# Patient Record
Sex: Female | Born: 1975 | Race: Black or African American | Hispanic: No | Marital: Single | Smoking: Never smoker
Health system: Southern US, Community
[De-identification: ages and names within clinical notes are randomized; demographics above are authoritative.]

## PROBLEM LIST (undated history)

## (undated) DIAGNOSIS — N946 Dysmenorrhea, unspecified: Secondary | ICD-10-CM

## (undated) DIAGNOSIS — N809 Endometriosis, unspecified: Secondary | ICD-10-CM

## (undated) DIAGNOSIS — E7800 Pure hypercholesterolemia, unspecified: Secondary | ICD-10-CM

## (undated) DIAGNOSIS — IMO0002 Reserved for concepts with insufficient information to code with codable children: Secondary | ICD-10-CM

## (undated) DIAGNOSIS — Z973 Presence of spectacles and contact lenses: Secondary | ICD-10-CM

## (undated) DIAGNOSIS — N939 Abnormal uterine and vaginal bleeding, unspecified: Secondary | ICD-10-CM

## (undated) DIAGNOSIS — E559 Vitamin D deficiency, unspecified: Secondary | ICD-10-CM

## (undated) HISTORY — PX: OTHER SURGICAL HISTORY: SHX169

## (undated) HISTORY — DX: Dysmenorrhea, unspecified: N94.6

## (undated) HISTORY — DX: Reserved for concepts with insufficient information to code with codable children: IMO0002

## (undated) HISTORY — DX: Vitamin D deficiency, unspecified: E55.9

## (undated) HISTORY — DX: Pure hypercholesterolemia, unspecified: E78.00

---

## 2002-12-10 DIAGNOSIS — IMO0002 Reserved for concepts with insufficient information to code with codable children: Secondary | ICD-10-CM

## 2002-12-10 HISTORY — DX: Reserved for concepts with insufficient information to code with codable children: IMO0002

## 2008-12-10 HISTORY — PX: OTHER SURGICAL HISTORY: SHX169

## 2013-05-04 ENCOUNTER — Emergency Department (HOSPITAL_COMMUNITY)
Admission: EM | Admit: 2013-05-04 | Discharge: 2013-05-04 | Disposition: A | Discharge status: Home / Self Care | Payer: Medicaid Other | Attending: Emergency Medicine | Admitting: Emergency Medicine

## 2013-05-04 ENCOUNTER — Emergency Department (HOSPITAL_COMMUNITY): Payer: Medicaid Other

## 2013-05-04 DIAGNOSIS — O9989 Other specified diseases and conditions complicating pregnancy, childbirth and the puerperium: Secondary | ICD-10-CM | POA: Insufficient documentation

## 2013-05-04 DIAGNOSIS — O209 Hemorrhage in early pregnancy, unspecified: Secondary | ICD-10-CM | POA: Insufficient documentation

## 2013-05-04 DIAGNOSIS — O4410 Placenta previa with hemorrhage, unspecified trimester: Secondary | ICD-10-CM | POA: Insufficient documentation

## 2013-05-04 DIAGNOSIS — O200 Threatened abortion: Secondary | ICD-10-CM | POA: Insufficient documentation

## 2013-05-04 DIAGNOSIS — Z79899 Other long term (current) drug therapy: Secondary | ICD-10-CM | POA: Insufficient documentation

## 2013-05-04 DIAGNOSIS — R10819 Abdominal tenderness, unspecified site: Secondary | ICD-10-CM | POA: Insufficient documentation

## 2013-05-04 DIAGNOSIS — O4401 Placenta previa specified as without hemorrhage, first trimester: Secondary | ICD-10-CM

## 2013-05-04 DIAGNOSIS — O4400 Placenta previa specified as without hemorrhage, unspecified trimester: Secondary | ICD-10-CM | POA: Insufficient documentation

## 2013-05-04 LAB — URINALYSIS, ROUTINE W REFLEX MICROSCOPIC
Glucose, UA: NEGATIVE mg/dL
Ketones, ur: NEGATIVE mg/dL
Nitrite: NEGATIVE
Protein, ur: NEGATIVE mg/dL

## 2013-05-04 LAB — CBC
HCT: 35.8 % — ABNORMAL LOW (ref 36.0–46.0)
MCV: 77 fL — ABNORMAL LOW (ref 78.0–100.0)
Platelets: 184 10*3/uL (ref 150–400)
RBC: 4.65 MIL/uL (ref 3.87–5.11)
RDW: 13.6 % (ref 11.5–15.5)
WBC: 10 10*3/uL (ref 4.0–10.5)

## 2013-05-04 LAB — ABO/RH

## 2013-05-04 LAB — URINE MICROSCOPIC-ADD ON

## 2013-05-04 LAB — HCG, QUANTITATIVE, PREGNANCY: hCG, Beta Chain, Quant, S: 139388 m[IU]/mL — ABNORMAL HIGH (ref <5)

## 2013-05-04 LAB — POCT PREGNANCY, URINE: Preg Test, Ur: POSITIVE — AB

## 2013-05-04 LAB — WET PREP, GENITAL: Trich, Wet Prep: NONE SEEN

## 2013-05-04 NOTE — ED Provider Notes (Signed)
History     CSN: [id number]  Arrival date & time 05/04/13  0126   First MD Initiated Contact with Patient 05/04/13 [phone]      Chief Complaint  Patient presents with  . Vaginal Bleeding   HPI  History provided by the patient. Patient is a 37 year old female [id number], who believes she is currently [redacted] weeks pregnant who presents with symptoms of vaginal bleeding. Patient awoke suddenly with reports of a small quarter-sized amount of bleeding and blood clots she passed in the toilet when getting into the bathroom. She denied having any associated abdominal or pelvic pain and cramps. She was very concerned of the bleeding because she has history of miscarriage in the past and came straight to the emergency room. She denies feeling lightheaded or near syncope. She denies any nausea or vomiting. She has felt well recently with no urinary symptoms. Her last menstrual cycle was 2 months ago. Last month she took a pregnancy test and was found to be pregnant. She believes she may be [redacted] weeks pregnant. Denies any other aggravating or alleviating factors. No other associated symptoms.    No past medical history on file.  No past surgical history on file.  No family history on file.  History  Substance Use Topics  . Smoking status: Not on file  . Smokeless tobacco: Not on file  . Alcohol Use: Not on file    OB History   No data available      Review of Systems  Constitutional: Negative for fever, chills and diaphoresis.  Gastrointestinal: Negative for nausea, vomiting and abdominal pain.  Genitourinary: Positive for vaginal bleeding. Negative for dysuria, frequency, hematuria, flank pain, vaginal discharge, vaginal pain and pelvic pain.  All other systems reviewed and are negative.    Allergies  Review of patient's allergies indicates no known allergies.  Home Medications   Current Outpatient Rx  Name  Route  Sig  Dispense  Refill  . Ascorbic Acid (VITAMIN C PO)   Oral   Take 1  tablet by mouth daily.         Marland Kitchen ibuprofen (ADVIL,MOTRIN) 200 MG tablet   Oral   Take 400 mg by mouth every 6 (six) hours as needed for pain.           BP 121/94  Pulse 101  Temp(Src) 99 F (37.2 C) (Oral)  Resp 14  SpO2 99%  Physical Exam  Nursing note and vitals reviewed. Constitutional: She is oriented to person, place, and time. She appears well-developed and well-nourished. No distress.  HENT:  Head: Normocephalic.  Cardiovascular: Normal rate and regular rhythm.   Pulmonary/Chest: Effort normal and breath sounds normal. No respiratory distress. She has no wheezes. She has no rales.  Abdominal: Soft. There is tenderness in the right lower quadrant and suprapubic area. There is no rebound, no guarding and no tenderness at McBurney's point.  Genitourinary:  Chaperone was present. Cervix is closed. There is a small amount of blood from the cervix and within the vaginal vault. No tissue or products of conception  Neurological: She is alert and oriented to person, place, and time.  Skin: Skin is warm and dry. No rash noted.  Psychiatric: She has a normal mood and affect. Her behavior is normal.    ED Course  Procedures   Results for orders placed during the hospital encounter of 05/04/13  WET PREP, GENITAL      Result Value Range   Yeast Wet Prep HPF POC NONE  SEEN  NONE SEEN   Trich, Wet Prep NONE SEEN  NONE SEEN   Clue Cells Wet Prep HPF POC MODERATE (*) NONE SEEN   WBC, Wet Prep HPF POC MODERATE (*) NONE SEEN  HCG, QUANTITATIVE, PREGNANCY      Result Value Range   hCG, Beta Francene Finders [id number] (*) <5 mIU/mL  CBC      Result Value Range   WBC 10.0  4.0 - 10.5 K/uL   RBC 4.65  3.87 - 5.11 MIL/uL   Hemoglobin 12.2  12.0 - 15.0 g/dL   HCT 95.6 (*) 21.3 - 08.6 %   MCV 77.0 (*) 78.0 - 100.0 fL   MCH 26.2  26.0 - 34.0 pg   MCHC 34.1  30.0 - 36.0 g/dL   RDW 57.8  46.9 - 62.9 %   Platelets 184  150 - 400 K/uL  URINALYSIS, ROUTINE W REFLEX MICROSCOPIC      Result  Value Range   Color, Urine YELLOW  YELLOW   APPearance TURBID (*) CLEAR   Specific Gravity, Urine 1.019  1.005 - 1.030   pH 6.0  5.0 - 8.0   Glucose, UA NEGATIVE  NEGATIVE mg/dL   Hgb urine dipstick LARGE (*) NEGATIVE   Bilirubin Urine NEGATIVE  NEGATIVE   Ketones, ur NEGATIVE  NEGATIVE mg/dL   Protein, ur NEGATIVE  NEGATIVE mg/dL   Urobilinogen, UA 0.2  0.0 - 1.0 mg/dL   Nitrite NEGATIVE  NEGATIVE   Leukocytes, UA SMALL (*) NEGATIVE  URINE MICROSCOPIC-ADD ON      Result Value Range   Squamous Epithelial / LPF MANY (*) RARE   WBC, UA 3-6  <3 WBC/hpf   RBC / HPF 0-2  <3 RBC/hpf   Bacteria, UA MANY (*) RARE   Urine-Other MUCOUS PRESENT    POCT PREGNANCY, URINE      Result Value Range   Preg Test, Ur POSITIVE (*) NEGATIVE  ABO/RH      Result Value Range   ABO/RH(D) O POS     No rh immune globuloin NOT A RH IMMUNE GLOBULIN CANDIDATE, PT RH POSITIVE        US Ob Limited  05/04/2013   *RADIOLOGY REPORT*  Clinical Data: Vaginal bleeding.  LIMITED OBSTETRIC ULTRASOUND AND TRANSVAGINAL OB US  Number of Fetuses: 1 Heart Rate: 147 bpm Movement: Yes Presentation: Variable Placental Location: Posterior Previa: Near-complete; fluid noted extending along the edge of the cervical os. Amniotic Fluid (Subjective): Normal Amniotic Fluid (Objective):  Vertical Pocket 3.1 cm                  AFI 9.82 cm  BPD: 2.45 cm   14 weeks 1 day                     EDC: 11/01/2013  MATERNAL FINDINGS: Cervix: Closed.  There appears to be a calcified 2.4 x 1.5 x 2.2 cm fibroid along the right lateral aspect of the uterus.  The right ovary is visualized, measuring 5.0 x 3.3 x 3.4 cm, and is grossly unremarkable in appearance.  The left ovary is not imaged on this study.  No free fluid is seen in the pelvic cul-de-sac.  IMPRESSION: There appears to be near-complete placenta previa at this time; the placenta extends to the cervical os, with fluid seen tracking along the edge of the cervical os.  The placenta appears to  involve only one side of the cervical os; the cervix remains closed.  Given  the gestational age of [redacted] weeks 1 day, the placenta will likely migrate superiorly with time, and placenta previa will typically resolve spontaneously.  The gestational age does not match the LMP, reflecting a new estimated date of delivery of November 01, 2013.  These results were called by telephone on 05/04/2013 at 06:01 a.m. to [facility] PA, who verbally acknowledged these results.   Original Report Authenticated By: Tonia Ghent, M.D.   US Ob Transvaginal  05/04/2013   *RADIOLOGY REPORT*  Clinical Data: Vaginal bleeding.  LIMITED OBSTETRIC ULTRASOUND AND TRANSVAGINAL OB US  Number of Fetuses: 1 Heart Rate: 147 bpm Movement: Yes Presentation: Variable Placental Location: Posterior Previa: Near-complete; fluid noted extending along the edge of the cervical os. Amniotic Fluid (Subjective): Normal Amniotic Fluid (Objective):  Vertical Pocket 3.1 cm                  AFI 9.82 cm  BPD: 2.45 cm   14 weeks 1 day                     EDC: 11/01/2013  MATERNAL FINDINGS: Cervix: Closed.  There appears to be a calcified 2.4 x 1.5 x 2.2 cm fibroid along the right lateral aspect of the uterus.  The right ovary is visualized, measuring 5.0 x 3.3 x 3.4 cm, and is grossly unremarkable in appearance.  The left ovary is not imaged on this study.  No free fluid is seen in the pelvic cul-de-sac.  IMPRESSION: There appears to be near-complete placenta previa at this time; the placenta extends to the cervical os, with fluid seen tracking along the edge of the cervical os.  The placenta appears to involve only one side of the cervical os; the cervix remains closed.  Given the gestational age of [redacted] weeks 1 day, the placenta will likely migrate superiorly with time, and placenta previa will typically resolve spontaneously.  The gestational age does not match the LMP, reflecting a new estimated date of delivery of November 01, 2013.  These results were  called by telephone on 05/04/2013 at 06:01 a.m. to [facility] PA, who verbally acknowledged these results.   Original Report Authenticated By: Tonia Ghent, M.D.     1. Placenta previa, first trimester   2. Vaginal bleeding before [redacted] weeks gestation   3. Threatened abortion       MDM  4:20 a.m. patient seen and evaluated. Patient resting comfortably appears well in no acute distress. Denies any abdominal pains.   Spoke with the radiologist. Ultrasound concerning for placenta previa. Patient also discussed with attending physician. Will consult OB/GYN.  I spoke with Dr. Janee Morn with OB/GYN. She has taken patient information and office we'll plan to call later today if it's open or first thing tomorrow at the latest. Advised that patient should abstain from any intercourse and have complete pelvic rest until seen in the office.     Angus Seller, PA-C 05/04/13 2124

## 2013-05-04 NOTE — ED Notes (Signed)
Pt states she is approximately [redacted] weeks pregnant and today she had quarter sized blood clots while using the bathroom

## 2013-05-04 NOTE — Discharge Instructions (Signed)
You were seen and evaluated for your concerns of bleeding in early pregnancy. Her ultrasound today showed a normal developing pregnancy within your uterus. It did however show concerns for a condition called placenta previa. Please followup closely with an OB/GYN specialist. You should abstain from intercourse and have complete pelvic rest until you followup with an OB/GYN specialist.     Placenta Previa Placenta previa is a condition in which the placenta has grown low in the womb (uterus). This is a condition in which the organ which connects the fetus to the mother's uterus (placenta) is low in the opening in the uterus (cervix). It can partially or completely cover the cervix. The cause of this is unknown. It is more common with multiple births or twins. SYMPTOMS  The main symptom or sign of placenta previa is vaginal bleeding. The bleeding can be mild to very heavy. This condition can be very serious for the mother and baby. Often there are no symptoms with placenta previa. Sometimes if the location of the placenta is very low it will become partially detached and cause bleeding. This may be simply a marginal sinus separation of the placenta. This is a separation of the vessels from the wall of the uterus. This may cause no further problems other than mild anxiety. There is an increase risk of intrauterine growth restriction (IUGR) with placenta previa because of the abnormal placement of the placenta. DIAGNOSIS  The diagnosis is usually made by ultrasound exam of the uterus. There may be a careful vaginal exam to see the cervix. The patient will be prepared for a Cesarean section immediately if necessary. TREATMENT  Treatment for placenta previa is usually bed rest in the hospital or at home. You may be given medication to stop contractions. Contractions can increase bleeding. Your doctor may take fluid from the baby's sac (amniocentesis) to see if the baby's lungs are mature enough for a  C-section. A blood transfusion may be necessary if you have a low blood count. No further treatment may be needed when placenta previa is present in small degrees. Early placenta previa may resolve on it's own. The placenta moves higher in the birth canal as pregnancy progresses. In this case the placenta no longer is an obstruction to birth. The position of the placenta may need to be reconfirmed during pregnancy. This can be done with an ultrasound exam of the belly(abdomen). Call your caregiver immediately if blood loss is severe. Immediate fluid or blood replacement may be necessary. With complete placenta previa, the only way to safely deliver the baby is by Cesarean section. HOME CARE INSTRUCTIONS   Follow your caregiver's advice about bed rest.  Take any iron pills or other medications your doctor gives to you.  No bending or lifting.  Do not have sexual intercourse.  Do not put anything in your vagina (tampons or vaginal creams). If you are bleeding, use sanitary pads.  Keep your doctors appointments as scheduled. Not keeping the appointment could result in a chronic or permanent injury, pain, disability and injury or death to you or your unborn baby. If there is any problem keeping the appointment, you must call back to this facility for assistance. SEEK IMMEDIATE MEDICAL CARE IF:   You have increased bleeding.  You have fainting episodes or feel lightheaded.  You develop abdominal pain.  You can no longer feel normal fetal or baby movements.  You develop uterine contractions. Document Released: 11/26/2005 Document Revised: 02/18/2012 Document Reviewed: 07/09/2008 [employer] Patient Information 2014  [employer] ° °

## 2013-05-04 NOTE — ED Notes (Signed)
NURSE FIRST ROUNDS ; NURSE EXPLAINED DELAY , WAIT TIME AND PROCESS TO PT. AND FAMILY,  DENIES PAIN , SLIGHT TIGHTNESS AT RIGHT LOWER ABDOMEN . RESPIRATIONS UNLABORED , AMBULATORY , VITAL SIGNS RECHECKED.

## 2013-05-04 NOTE — ED Notes (Signed)
Patient states she is having lower abdominal pain starting today and passing small amount of blood, patient states positive pregnancy test x approx 1 month ago,

## 2013-05-04 NOTE — ED Notes (Addendum)
Pelvic exam performed by Ivonne Andrew PA with this RN present. Pt tolerated well.

## 2013-05-04 NOTE — ED Notes (Signed)
PT comfortable with d/c and f/u instructions. No prescriptions. Pt ambulatory.

## 2013-05-05 LAB — GC/CHLAMYDIA PROBE AMP: CT Probe RNA: NEGATIVE

## 2013-05-05 LAB — URINE CULTURE

## 2013-05-07 NOTE — ED Notes (Signed)
Post ED Visit - Positive Culture Follow-up  Culture report reviewed by antimicrobial stewardship pharmacist: []  Wes Dulaney, Pharm.D., BCPS []  Celedonio Miyamoto, [address].D., BCPS []  Georgina Pillion, Pharm.D., BCPS [x]  [place], [place].D., BCPS, AAHIVP []  Estella Husk, Pharm.D., BCPS, AAHIV  Positive urine culture   no further patient follow-up is required at this time.  Jamie [name] 05/07/2013, 2:12 PM

## 2013-05-09 NOTE — ED Provider Notes (Signed)
Medical screening examination/treatment/procedure(s) were performed by non-physician practitioner and as supervising physician I was immediately available for consultation/collaboration. Devoria Albe, MD, [facility]ando Gang   Ward Givens, MD 05/09/13 [phone]

## 2013-06-02 ENCOUNTER — Other Ambulatory Visit (HOSPITAL_COMMUNITY)
Admission: RE | Admit: 2013-06-02 | Discharge: 2013-06-02 | Disposition: A | Discharge status: Home / Self Care | Payer: Medicaid Other | Source: Ambulatory Visit | Attending: Obstetrics & Gynecology | Admitting: Obstetrics & Gynecology

## 2013-06-02 ENCOUNTER — Encounter: Payer: Self-pay | Admitting: Obstetrics & Gynecology

## 2013-06-02 ENCOUNTER — Ambulatory Visit (INDEPENDENT_AMBULATORY_CARE_PROVIDER_SITE_OTHER): Payer: Medicaid Other | Admitting: Obstetrics & Gynecology

## 2013-06-02 VITALS — BP 109/76 | Temp 97.3°F | Ht 62.0 in | Wt 134.0 lb

## 2013-06-02 DIAGNOSIS — O09529 Supervision of elderly multigravida, unspecified trimester: Secondary | ICD-10-CM

## 2013-06-02 DIAGNOSIS — IMO0002 Reserved for concepts with insufficient information to code with codable children: Secondary | ICD-10-CM | POA: Insufficient documentation

## 2013-06-02 DIAGNOSIS — Z1151 Encounter for screening for human papillomavirus (HPV): Secondary | ICD-10-CM | POA: Insufficient documentation

## 2013-06-02 DIAGNOSIS — Z01419 Encounter for gynecological examination (general) (routine) without abnormal findings: Secondary | ICD-10-CM | POA: Insufficient documentation

## 2013-06-02 DIAGNOSIS — Z3492 Encounter for supervision of normal pregnancy, unspecified, second trimester: Secondary | ICD-10-CM

## 2013-06-02 DIAGNOSIS — B373 Candidiasis of vulva and vagina: Secondary | ICD-10-CM

## 2013-06-02 DIAGNOSIS — O09522 Supervision of elderly multigravida, second trimester: Secondary | ICD-10-CM

## 2013-06-02 DIAGNOSIS — B3731 Acute candidiasis of vulva and vagina: Secondary | ICD-10-CM

## 2013-06-02 DIAGNOSIS — Z113 Encounter for screening for infections with a predominantly sexual mode of transmission: Secondary | ICD-10-CM | POA: Insufficient documentation

## 2013-06-02 MED ORDER — FLUCONAZOLE 150 MG PO TABS: 150.0000 mg | ORAL_TABLET | Freq: Once | ORAL | Status: DC

## 2013-06-02 MED ORDER — PRENATAL VITAMINS 0.8 MG PO TABS: 1.0000 | ORAL_TABLET | Freq: Every day | ORAL | Status: DC

## 2013-06-02 NOTE — Progress Notes (Signed)
Pulse 90 Edema trace in feet.

## 2013-06-02 NOTE — Patient Instructions (Signed)
Pregnancy - Second Trimester The second trimester of pregnancy (3 to 6 months) is a period of rapid growth for you and your baby. At the end of the sixth month, your baby is about 9 inches long and weighs 1 1/2 pounds. You will begin to feel the baby move between 18 and 20 weeks of the pregnancy. This is called quickening. Weight gain is faster. A clear fluid (colostrum) may leak out of your breasts. You may feel small contractions of the womb (uterus). This is known as false labor or Braxton-Hicks contractions. This is like a practice for labor when the baby is ready to be born. Usually, the problems with morning sickness have usually passed by the end of your first trimester. Some women develop small dark blotches (called cholasma, mask of pregnancy) on their face that usually goes away after the baby is born. Exposure to the sun makes the blotches worse. Acne may also develop in some pregnant women and pregnant women who have acne, may find that it goes away. PRENATAL EXAMS  Blood work may continue to be done during prenatal exams. These tests are done to check on your health and the probable health of your baby. Blood work is used to follow your blood levels (hemoglobin). Anemia (low hemoglobin) is common during pregnancy. Iron and vitamins are given to help prevent this. You will also be checked for diabetes between 24 and 28 weeks of the pregnancy. Some of the previous blood tests may be repeated.  The size of the uterus is measured during each visit. This is to make sure that the baby is continuing to grow properly according to the dates of the pregnancy.  Your blood pressure is checked every prenatal visit. This is to make sure you are not getting toxemia.  Your urine is checked to make sure you do not have an infection, diabetes or protein in the urine.  Your weight is checked often to make sure gains are happening at the suggested rate. This is to ensure that both you and your baby are  growing normally.  Sometimes, an ultrasound is performed to confirm the proper growth and development of the baby. This is a test which bounces harmless sound waves off the baby so your caregiver can more accurately determine due dates. Sometimes, a test is done on the amniotic fluid surrounding the baby. This test is called an amniocentesis. The amniotic fluid is obtained by sticking a needle into the belly (abdomen). This is done to check the chromosomes in instances where there is a concern about possible genetic problems with the baby. It is also sometimes done near the end of pregnancy if an early delivery is required. In this case, it is done to help make sure the baby's lungs are mature enough for the baby to live outside of the womb. CHANGES OCCURING IN THE SECOND TRIMESTER OF PREGNANCY Your body goes through many changes during pregnancy. They vary from person to person. Talk to your caregiver about changes you notice that you are concerned about.  During the second trimester, you will likely have an increase in your appetite. It is normal to have cravings for certain foods. This varies from person to person and pregnancy to pregnancy.  Your lower abdomen will begin to bulge.  You may have to urinate more often because the uterus and baby are pressing on your bladder. It is also common to get more bladder infections during pregnancy. You can help this by drinking lots of fluids   and emptying your bladder before and after intercourse.  You may begin to get stretch marks on your hips, abdomen, and breasts. These are normal changes in the body during pregnancy. There are no exercises or medicines to take that prevent this change.  You may begin to develop swollen and bulging veins (varicose veins) in your legs. Wearing support hose, elevating your feet for 15 minutes, 3 to 4 times a day and limiting salt in your diet helps lessen the problem.  Heartburn may develop as the uterus grows and  pushes up against the stomach. Antacids recommended by your caregiver helps with this problem. Also, eating smaller meals 4 to 5 times a day helps.  Constipation can be treated with a stool softener or adding bulk to your diet. Drinking lots of fluids, and eating vegetables, fruits, and whole grains are helpful.  Exercising is also helpful. If you have been very active up until your pregnancy, most of these activities can be continued during your pregnancy. If you have been less active, it is helpful to start an exercise program such as walking.  Hemorrhoids may develop at the end of the second trimester. Warm sitz baths and hemorrhoid cream recommended by your caregiver helps hemorrhoid problems.  Backaches may develop during this time of your pregnancy. Avoid heavy lifting, wear low heal shoes, and practice good posture to help with backache problems.  Some pregnant women develop tingling and numbness of their hand and fingers because of swelling and tightening of ligaments in the wrist (carpel tunnel syndrome). This goes away after the baby is born.  As your breasts enlarge, you may have to get a bigger bra. Get a comfortable, cotton, support bra. Do not get a nursing bra until the last month of the pregnancy if you will be nursing the baby.  You may get a dark line from your belly button to the pubic area called the linea nigra.  You may develop rosy cheeks because of increase blood flow to the face.  You may develop spider looking lines of the face, neck, arms, and chest. These go away after the baby is born. HOME CARE INSTRUCTIONS   It is extremely important to avoid all smoking, herbs, alcohol, and unprescribed drugs during your pregnancy. These chemicals affect the formation and growth of the baby. Avoid these chemicals throughout the pregnancy to ensure the delivery of a healthy infant.  Most of your home care instructions are the same as suggested for the first trimester of your  pregnancy. Keep your caregiver's appointments. Follow your caregiver's instructions regarding medicine use, exercise, and diet.  During pregnancy, you are providing food for you and your baby. Continue to eat regular, well-balanced meals. Choose foods such as meat, fish, milk and other low fat dairy products, vegetables, fruits, and whole-grain breads and cereals. Your caregiver will tell you of the ideal weight gain.  A physical sexual relationship may be continued up until near the end of pregnancy if there are no other problems. Problems could include early (premature) leaking of amniotic fluid from the membranes, vaginal bleeding, abdominal pain, or other medical or pregnancy problems.  Exercise regularly if there are no restrictions. Check with your caregiver if you are unsure of the safety of some of your exercises. The greatest weight gain will occur in the last 2 trimesters of pregnancy. Exercise will help you:  Control your weight.  Get you in shape for labor and delivery.  Lose weight after you have the baby.  Wear   a good support or jogging bra for breast tenderness during pregnancy. This may help if worn during sleep. Pads or tissues may be used in the bra if you are leaking colostrum.  Do not use hot tubs, steam rooms or saunas throughout the pregnancy.  Wear your seat belt at all times when driving. This protects you and your baby if you are in an accident.  Avoid raw meat, uncooked cheese, cat litter boxes, and soil used by cats. These carry germs that can cause birth defects in the baby.  The second trimester is also a good time to visit your dentist for your dental health if this has not been done yet. Getting your teeth cleaned is okay. Use a soft toothbrush. Brush gently during pregnancy.  It is easier to leak urine during pregnancy. Tightening up and strengthening the pelvic muscles will help with this problem. Practice stopping your urination while you are going to the  bathroom. These are the same muscles you need to strengthen. It is also the muscles you would use as if you were trying to stop from passing gas. You can practice tightening these muscles up 10 times a set and repeating this about 3 times per day. Once you know what muscles to tighten up, do not perform these exercises during urination. It is more likely to contribute to an infection by backing up the urine.  Ask for help if you have financial, counseling, or nutritional needs during pregnancy. Your caregiver will be able to offer counseling for these needs as well as refer you for other special needs.  Your skin may become oily. If so, wash your face with mild soap, use non-greasy moisturizer and oil or cream based makeup. MEDICINES AND DRUG USE IN PREGNANCY  Take prenatal vitamins as directed. The vitamin should contain 1 milligram of folic acid. Keep all vitamins out of reach of children. Only a couple vitamins or tablets containing iron may be fatal to a baby or young child when ingested.  Avoid use of all medicines, including herbs, over-the-counter medicines, not prescribed or suggested by your caregiver. Only take over-the-counter or prescription medicines for pain, discomfort, or fever as directed by your caregiver. Do not use aspirin.  Let your caregiver also know about herbs you may be using.  Alcohol is related to a number of birth defects. This includes fetal alcohol syndrome. All alcohol, in any form, should be avoided completely. Smoking will cause low birth rate and premature babies.  Street or illegal drugs are very harmful to the baby. They are absolutely forbidden. A baby born to an addicted mother will be addicted at birth. The baby will go through the same withdrawal an adult does. SEEK MEDICAL CARE IF:  You have any concerns or worries during your pregnancy. It is better to call with your questions if you feel they cannot wait, rather than worry about them. SEEK IMMEDIATE  MEDICAL CARE IF:   An unexplained oral temperature above 102 F (38.9 C) develops, or as your caregiver suggests.  You have leaking of fluid from the vagina (birth canal). If leaking membranes are suspected, take your temperature and tell your caregiver of this when you call.  There is vaginal spotting, bleeding, or passing clots. Tell your caregiver of the amount and how many pads are used. Light spotting in pregnancy is common, especially following intercourse.  You develop a bad smelling vaginal discharge with a change in the color from clear to white.  You continue to feel   sick to your stomach (nauseated) and have no relief from remedies suggested. You vomit blood or coffee ground-like materials.  You lose more than 2 pounds of weight or gain more than 2 pounds of weight over 1 week, or as suggested by your caregiver.  You notice swelling of your face, hands, feet, or legs.  You get exposed to German measles and have never had them.  You are exposed to fifth disease or chickenpox.  You develop belly (abdominal) pain. Round ligament discomfort is a common non-cancerous (benign) cause of abdominal pain in pregnancy. Your caregiver still must evaluate you.  You develop a bad headache that does not go away.  You develop fever, diarrhea, pain with urination, or shortness of breath.  You develop visual problems, blurry, or double vision.  You fall or are in a car accident or any kind of trauma.  There is mental or physical violence at home. Document Released: 11/20/2001 Document Revised: 08/20/2012 Document Reviewed: 05/25/2009 [employer] Patient Information 2014 [employer].  

## 2013-06-02 NOTE — Progress Notes (Signed)
   Subjective:    Jamie [name] is a [id number] [redacted]w[redacted]d being seen today for her first obstetrical visit.  Her obstetrical history is significant for two term SVDs and current advanced maternal age.  Patient was seen in [facility]U for bleeding around 14 weeks, no bleeding since then. Normal placenta on ultrasound. Patient reports that she has a yeast infection; wants treatment for this.  Also wants prenatal vitamins.   Filed Vitals:   06/02/13 1350 06/02/13 1354  BP: 109/76   Temp: 97.3 F (36.3 C)   Height:  5\' 2"  (1.575 m)  Weight: 134 lb (60.782 kg)   FHR 145, FH 18  HISTORY: OB History   Grav Para Term Preterm Abortions TAB SAB Ect Mult Living   4 2 2  0 1 0 1   2     # Outc Date GA Lbr Len/2nd Wgt Sex Del Anes PTL Lv   1 TRM 7/01 [redacted]w[redacted]d    SVD      2 TRM 12/12 [redacted]w[redacted]d   M SVD EPI  Yes   3 SAB            4 CUR              Past Medical History  Diagnosis Date  . Abnormal Pap smear 2004   History reviewed. No pertinent past surgical history. Family History  Problem Relation Age of Onset  . Hyperlipidemia Mother   . Hyperlipidemia Maternal Grandmother     Exam    Uterus:  Fundal Height: 18 cm  Pelvic Exam:    Perineum: No Hemorrhoids, Normal Perineum   Vulva: Erythema around introitus   Vagina:  normal mucosa, thick cottage-cheese like white discharge noted.    Cervix: multiparous appearance and mild bleeding following pap   Adnexa: normal adnexa and no mass, fullness, tenderness   Bony Pelvis: gynecoid  System: Breast:  normal appearance, no masses or tenderness   Skin: normal coloration and turgor, no rashes   Neurologic: normal   Extremities: normal strength, tone, and muscle mass   HEENT PERRLA   Mouth/Teeth mucous membranes moist, pharynx normal without lesions and dental hygiene good   Neck supple and no masses   Cardiovascular: regular rate and rhythm   Respiratory:  appears well, vitals normal, no respiratory distress, acyanotic, normal RR   Abdomen: soft,  non-tender; bowel sounds normal; no masses,  no organomegaly   Urinary: urethral meatus normal     Assessment:    Pregnancy: [id number] Patient Active Problem List   Diagnosis Date Noted  . Advanced maternal age, antepartum 06/02/2013     Plan:   Initial labs drawn. Prenatal vitamins prescribed. Diflucan ordered for presumptive yeast infection; patient to call if symptoms worsen Problem list reviewed and updated. Genetic Screening discussed Quad Screen: ordered. Ultrasound discussed; fetal survey: ordered. Follow up as scheduled  Tereso Newcomer, M.D. 06/02/2013

## 2013-06-03 LAB — OBSTETRIC PANEL
Eosinophils Absolute: 0.1 10*3/uL (ref 0.0–0.7)
Hemoglobin: 11.7 g/dL — ABNORMAL LOW (ref 12.0–15.0)
Hepatitis B Surface Ag: NEGATIVE
Lymphocytes Relative: 20 % (ref 12–46)
Lymphs Abs: 1.9 10*3/uL (ref 0.7–4.0)
MCH: 25.7 pg — ABNORMAL LOW (ref 26.0–34.0)
Monocytes Relative: 6 % (ref 3–12)
Neutro Abs: 7 10*3/uL (ref 1.7–7.7)
Neutrophils Relative %: 73 % (ref 43–77)
Platelets: 188 10*3/uL (ref 150–400)
RBC: 4.55 MIL/uL (ref 3.87–5.11)
Rh Type: POSITIVE
WBC: 9.4 10*3/uL (ref 4.0–10.5)

## 2013-06-04 LAB — CULTURE, OB URINE: Colony Count: 60000

## 2013-06-04 LAB — HEMOGLOBINOPATHY EVALUATION
Hemoglobin Other: 0 %
Hgb A: 96.8 % (ref 96.8–97.8)
Hgb S Quant: 0 %

## 2013-06-05 LAB — POCT URINALYSIS DIP (DEVICE)
Glucose, UA: NEGATIVE mg/dL
Protein, ur: NEGATIVE mg/dL
Urobilinogen, UA: 1 mg/dL (ref 0.0–1.0)

## 2013-06-08 ENCOUNTER — Encounter: Payer: Self-pay | Admitting: Advanced Practice Midwife

## 2013-06-09 ENCOUNTER — Encounter: Payer: Self-pay | Admitting: *Deleted

## 2013-06-09 ENCOUNTER — Ambulatory Visit (HOSPITAL_COMMUNITY)
Admission: RE | Admit: 2013-06-09 | Discharge: 2013-06-09 | Disposition: A | Discharge status: Home / Self Care | Payer: Medicaid Other | Source: Ambulatory Visit | Attending: Obstetrics & Gynecology | Admitting: Obstetrics & Gynecology

## 2013-06-09 ENCOUNTER — Encounter: Payer: Self-pay | Admitting: Obstetrics & Gynecology

## 2013-06-09 DIAGNOSIS — O4440 Low lying placenta NOS or without hemorrhage, unspecified trimester: Secondary | ICD-10-CM | POA: Insufficient documentation

## 2013-06-09 DIAGNOSIS — O09529 Supervision of elderly multigravida, unspecified trimester: Secondary | ICD-10-CM | POA: Insufficient documentation

## 2013-06-09 DIAGNOSIS — Z3480 Encounter for supervision of other normal pregnancy, unspecified trimester: Secondary | ICD-10-CM | POA: Insufficient documentation

## 2013-06-09 DIAGNOSIS — Z3492 Encounter for supervision of normal pregnancy, unspecified, second trimester: Secondary | ICD-10-CM

## 2013-06-30 ENCOUNTER — Ambulatory Visit (INDEPENDENT_AMBULATORY_CARE_PROVIDER_SITE_OTHER): Payer: Medicaid Other | Admitting: Obstetrics and Gynecology

## 2013-06-30 ENCOUNTER — Encounter: Payer: Self-pay | Admitting: Obstetrics and Gynecology

## 2013-06-30 VITALS — BP 114/72 | Temp 98.1°F | Wt 142.7 lb

## 2013-06-30 DIAGNOSIS — O4400 Placenta previa specified as without hemorrhage, unspecified trimester: Secondary | ICD-10-CM

## 2013-06-30 DIAGNOSIS — O09522 Supervision of elderly multigravida, second trimester: Secondary | ICD-10-CM

## 2013-06-30 DIAGNOSIS — O4402 Placenta previa specified as without hemorrhage, second trimester: Secondary | ICD-10-CM

## 2013-06-30 DIAGNOSIS — O09529 Supervision of elderly multigravida, unspecified trimester: Secondary | ICD-10-CM

## 2013-06-30 DIAGNOSIS — B373 Candidiasis of vulva and vagina: Secondary | ICD-10-CM

## 2013-06-30 LAB — POCT URINALYSIS DIP (DEVICE)
Bilirubin Urine: NEGATIVE
Glucose, UA: NEGATIVE mg/dL
Nitrite: NEGATIVE
Specific Gravity, Urine: 1.02 (ref 1.005–1.030)
Urobilinogen, UA: 0.2 mg/dL (ref 0.0–1.0)

## 2013-06-30 NOTE — Progress Notes (Signed)
Pulse- 96   Edema-hands/feet  Pain-"when he balls up" Pt is currently taking diflucan for yeast infection

## 2013-06-30 NOTE — Progress Notes (Signed)
Patient doing well complaining of vaginal pruritis and discharge consistent with yeast infection. Patient took diflucan last week and symptoms returned. Advised to take diflucan again and if symptoms persist will obtain wet prep to rule out BV. Low lying placenta precautions reviewed. Will repeat ultrasound at 28 weeks.

## 2013-07-28 ENCOUNTER — Ambulatory Visit (INDEPENDENT_AMBULATORY_CARE_PROVIDER_SITE_OTHER): Payer: Medicaid Other | Admitting: Obstetrics & Gynecology

## 2013-07-28 VITALS — BP 107/72 | Temp 97.0°F | Wt 150.8 lb

## 2013-07-28 DIAGNOSIS — O4402 Placenta previa specified as without hemorrhage, second trimester: Secondary | ICD-10-CM

## 2013-07-28 DIAGNOSIS — O4400 Placenta previa specified as without hemorrhage, unspecified trimester: Secondary | ICD-10-CM

## 2013-07-28 DIAGNOSIS — Z23 Encounter for immunization: Secondary | ICD-10-CM

## 2013-07-28 LAB — POCT URINALYSIS DIP (DEVICE)
Ketones, ur: NEGATIVE mg/dL
Protein, ur: NEGATIVE mg/dL
Specific Gravity, Urine: 1.015 (ref 1.005–1.030)
Urobilinogen, UA: 0.2 mg/dL (ref 0.0–1.0)
pH: 7 (ref 5.0–8.0)

## 2013-07-28 LAB — CBC
Platelets: 159 10*3/uL (ref 150–400)
RBC: 4.24 MIL/uL (ref 3.87–5.11)
RDW: 16.3 % — ABNORMAL HIGH (ref 11.5–15.5)
WBC: 11.3 10*3/uL — ABNORMAL HIGH (ref 4.0–10.5)

## 2013-07-28 MED ORDER — TETANUS-DIPHTH-ACELL PERTUSSIS 5-2.5-18.5 LF-MCG/0.5 IM SUSP
0.5000 mL | Freq: Once | INTRAMUSCULAR | Status: AC
  Administered 2013-07-28: 0.5 mL via INTRAMUSCULAR

## 2013-07-28 NOTE — Addendum Note (Signed)
Addended by: Kathee Delton on: 07/28/2013 09:42 AM   Modules accepted: Orders

## 2013-07-28 NOTE — Progress Notes (Signed)
Routine visit. Good FM. Glucola and labs, TDAP today. She wants a PPS, so she will sign MCD forms today.

## 2013-07-28 NOTE — Progress Notes (Signed)
Pulse- 102 Patient reports some discomfort with fetal movements

## 2013-07-29 LAB — RPR

## 2013-07-29 LAB — HIV ANTIBODY (ROUTINE TESTING W REFLEX): HIV: NONREACTIVE

## 2013-07-29 LAB — GLUCOSE TOLERANCE, 1 HOUR (50G) W/O FASTING: Glucose, 1 Hour GTT: 86 mg/dL (ref 70–140)

## 2013-08-07 ENCOUNTER — Encounter: Payer: Self-pay | Admitting: *Deleted

## 2013-08-11 ENCOUNTER — Ambulatory Visit (HOSPITAL_COMMUNITY)
Admission: RE | Admit: 2013-08-11 | Discharge: 2013-08-11 | Disposition: A | Discharge status: Home / Self Care | Payer: Medicaid Other | Source: Ambulatory Visit | Attending: Obstetrics & Gynecology | Admitting: Obstetrics & Gynecology

## 2013-08-11 ENCOUNTER — Other Ambulatory Visit: Payer: Self-pay | Admitting: Obstetrics & Gynecology

## 2013-08-11 DIAGNOSIS — O4400 Placenta previa specified as without hemorrhage, unspecified trimester: Secondary | ICD-10-CM | POA: Insufficient documentation

## 2013-08-11 DIAGNOSIS — O09529 Supervision of elderly multigravida, unspecified trimester: Secondary | ICD-10-CM | POA: Insufficient documentation

## 2013-08-11 DIAGNOSIS — O4402 Placenta previa specified as without hemorrhage, second trimester: Secondary | ICD-10-CM

## 2013-08-12 NOTE — Progress Notes (Addendum)
Jamie [name]  was seen today for an ultrasound appointment.  See full report in [facility].  Impression: Single IUP at 28 2/7 weeks Echogenic intracardiac focus was again noted No other markers associated with aneuploidy were appreciated Interval anatomy was otherwise within normal limits A posterior placenta was noted, well away from the cervical os Normal amniotic fluid volume  Remote read - the patient was not counseled regarding the findings of an echogenic intracardiac focus  Recommendations: Follow-up ultrasounds as clinically indicated.   Alpha Gula, MD

## 2013-08-18 ENCOUNTER — Encounter: Payer: Medicaid Other | Admitting: Family Medicine

## 2013-08-19 ENCOUNTER — Encounter: Payer: Self-pay | Admitting: Obstetrics & Gynecology

## 2013-08-26 ENCOUNTER — Ambulatory Visit (INDEPENDENT_AMBULATORY_CARE_PROVIDER_SITE_OTHER): Payer: Medicaid Other | Admitting: Family Medicine

## 2013-08-26 ENCOUNTER — Encounter: Payer: Self-pay | Admitting: Family Medicine

## 2013-08-26 ENCOUNTER — Encounter: Payer: Self-pay | Admitting: *Deleted

## 2013-08-26 VITALS — BP 116/77 | Temp 97.6°F | Wt 157.3 lb

## 2013-08-26 DIAGNOSIS — O09523 Supervision of elderly multigravida, third trimester: Secondary | ICD-10-CM

## 2013-08-26 DIAGNOSIS — O09529 Supervision of elderly multigravida, unspecified trimester: Secondary | ICD-10-CM

## 2013-08-26 LAB — POCT URINALYSIS DIP (DEVICE)
Hgb urine dipstick: NEGATIVE
Nitrite: NEGATIVE
Protein, ur: NEGATIVE mg/dL
Specific Gravity, Urine: 1.02 (ref 1.005–1.030)
Urobilinogen, UA: 0.2 mg/dL (ref 0.0–1.0)

## 2013-08-26 NOTE — Progress Notes (Signed)
S: pt is a 37 yo [id number] @ [redacted]w[redacted]d here for return OBV - doing well. No concerns -good fm - No ctx, LOF, VB  O: see flowsheet  A/p:  Doing well Preterm Labor and FM precautions discussed

## 2013-08-26 NOTE — Progress Notes (Signed)
P=92,   

## 2013-09-01 ENCOUNTER — Encounter (HOSPITAL_COMMUNITY): Payer: Self-pay | Admitting: *Deleted

## 2013-09-01 ENCOUNTER — Inpatient Hospital Stay (HOSPITAL_COMMUNITY)
Admission: AD | Admit: 2013-09-01 | Discharge: 2013-09-08 | DRG: 765 | Disposition: A | Discharge status: Home / Self Care | Payer: Medicaid Other | Source: Ambulatory Visit | Attending: Obstetrics & Gynecology | Admitting: Obstetrics & Gynecology

## 2013-09-01 DIAGNOSIS — O09529 Supervision of elderly multigravida, unspecified trimester: Secondary | ICD-10-CM | POA: Diagnosis present

## 2013-09-01 DIAGNOSIS — O411090 Infection of amniotic sac and membranes, unspecified, unspecified trimester, not applicable or unspecified: Secondary | ICD-10-CM | POA: Diagnosis present

## 2013-09-01 DIAGNOSIS — O321XX0 Maternal care for breech presentation, not applicable or unspecified: Secondary | ICD-10-CM | POA: Diagnosis present

## 2013-09-01 DIAGNOSIS — O09522 Supervision of elderly multigravida, second trimester: Secondary | ICD-10-CM

## 2013-09-01 DIAGNOSIS — O4290 Premature rupture of membranes, unspecified as to length of time between rupture and onset of labor, unspecified weeks of gestation: Principal | ICD-10-CM | POA: Diagnosis present

## 2013-09-01 DIAGNOSIS — O09523 Supervision of elderly multigravida, third trimester: Secondary | ICD-10-CM

## 2013-09-01 DIAGNOSIS — Z302 Encounter for sterilization: Secondary | ICD-10-CM

## 2013-09-01 DIAGNOSIS — IMO0002 Reserved for concepts with insufficient information to code with codable children: Secondary | ICD-10-CM

## 2013-09-01 DIAGNOSIS — O26879 Cervical shortening, unspecified trimester: Secondary | ICD-10-CM | POA: Diagnosis present

## 2013-09-01 DIAGNOSIS — O42913 Preterm premature rupture of membranes, unspecified as to length of time between rupture and onset of labor, third trimester: Secondary | ICD-10-CM | POA: Diagnosis present

## 2013-09-01 NOTE — MAU Note (Signed)
Pt G4 P2 at 31.2wks leaking clear fluid since 1500, and having abd pressure that comes and goes. Denies problems with pregnancy.

## 2013-09-02 ENCOUNTER — Encounter (HOSPITAL_COMMUNITY): Payer: Self-pay | Admitting: *Deleted

## 2013-09-02 ENCOUNTER — Inpatient Hospital Stay (HOSPITAL_COMMUNITY): Payer: Medicaid Other

## 2013-09-02 LAB — CBC
HCT: 29.9 % — ABNORMAL LOW (ref 36.0–46.0)
Hemoglobin: 10 g/dL — ABNORMAL LOW (ref 12.0–15.0)
MCHC: 33.4 g/dL (ref 30.0–36.0)
Platelets: 133 10*3/uL — ABNORMAL LOW (ref 150–400)
RBC: 3.71 MIL/uL — ABNORMAL LOW (ref 3.87–5.11)

## 2013-09-02 LAB — AMNISURE RUPTURE OF MEMBRANE (ROM) NOT AT ARMC: Amnisure ROM: POSITIVE

## 2013-09-02 LAB — FETAL FIBRONECTIN: Fetal Fibronectin: POSITIVE — AB

## 2013-09-02 MED ORDER — DOCUSATE SODIUM 100 MG PO CAPS
100.0000 mg | ORAL_CAPSULE | Freq: Every day | ORAL | Status: DC
  Administered 2013-09-02 – 2013-09-03 (×2): 100 mg via ORAL
  Filled 2013-09-02 (×2): qty 1

## 2013-09-02 MED ORDER — SODIUM CHLORIDE 0.9 % IV SOLN
2.0000 g | Freq: Four times a day (QID) | INTRAVENOUS | Status: AC
  Administered 2013-09-02 – 2013-09-03 (×8): 2 g via INTRAVENOUS
  Filled 2013-09-02 (×8): qty 2000

## 2013-09-02 MED ORDER — SODIUM CHLORIDE 0.9 % IV SOLN
250.0000 mg | Freq: Four times a day (QID) | INTRAVENOUS | Status: AC
  Administered 2013-09-02 – 2013-09-03 (×8): 250 mg via INTRAVENOUS
  Filled 2013-09-02 (×8): qty 250

## 2013-09-02 MED ORDER — MAGNESIUM SULFATE BOLUS VIA INFUSION
6.0000 g | Freq: Once | INTRAVENOUS | Status: AC
  Administered 2013-09-02: 6 g via INTRAVENOUS
  Filled 2013-09-02: qty 500

## 2013-09-02 MED ORDER — CALCIUM CARBONATE ANTACID 500 MG PO CHEW
2.0000 | CHEWABLE_TABLET | ORAL | Status: DC | PRN
  Administered 2013-09-04: 400 mg via ORAL
  Filled 2013-09-02: qty 2

## 2013-09-02 MED ORDER — LACTATED RINGERS IV SOLN: INTRAVENOUS | Status: DC

## 2013-09-02 MED ORDER — ACETAMINOPHEN 325 MG PO TABS: 650.0000 mg | ORAL_TABLET | ORAL | Status: DC | PRN

## 2013-09-02 MED ORDER — ERYTHROMYCIN BASE 250 MG PO TABS
250.0000 mg | ORAL_TABLET | Freq: Four times a day (QID) | ORAL | Status: DC
Start: 2013-09-04 — End: 2013-09-06
  Administered 2013-09-04 – 2013-09-06 (×10): 250 mg via ORAL
  Filled 2013-09-02 (×15): qty 1

## 2013-09-02 MED ORDER — BETAMETHASONE SOD PHOS & ACET 6 (3-3) MG/ML IJ SUSP
12.0000 mg | Freq: Once | INTRAMUSCULAR | Status: AC
  Administered 2013-09-02: 12 mg via INTRAMUSCULAR
  Filled 2013-09-02: qty 2

## 2013-09-02 MED ORDER — AMOXICILLIN 500 MG PO CAPS
500.0000 mg | ORAL_CAPSULE | Freq: Three times a day (TID) | ORAL | Status: DC
  Administered 2013-09-04 – 2013-09-05 (×7): 500 mg via ORAL
  Filled 2013-09-02 (×11): qty 1

## 2013-09-02 MED ORDER — BETAMETHASONE SOD PHOS & ACET 6 (3-3) MG/ML IJ SUSP
12.0000 mg | Freq: Once | INTRAMUSCULAR | Status: AC
  Administered 2013-09-03: 12 mg via INTRAMUSCULAR
  Filled 2013-09-02: qty 2

## 2013-09-02 MED ORDER — ZOLPIDEM TARTRATE 5 MG PO TABS: 5.0000 mg | ORAL_TABLET | Freq: Every evening | ORAL | Status: DC | PRN

## 2013-09-02 MED ORDER — TERBUTALINE SULFATE 1 MG/ML IJ SOLN
0.2500 mg | Freq: Once | INTRAMUSCULAR | Status: AC
Start: 2013-09-02 — End: 2013-09-02
  Administered 2013-09-02: 0.25 mg via INTRAVENOUS
  Filled 2013-09-02: qty 1

## 2013-09-02 MED ORDER — MAGNESIUM SULFATE 40 G IN LACTATED RINGERS - SIMPLE
2.0000 g/h | INTRAVENOUS | Status: DC
  Administered 2013-09-02: 2 g/h via INTRAVENOUS
  Filled 2013-09-02 (×2): qty 500

## 2013-09-02 MED ORDER — LACTATED RINGERS IV SOLN
INTRAVENOUS | Status: DC
  Administered 2013-09-02 – 2013-09-04 (×5): via INTRAVENOUS

## 2013-09-02 MED ORDER — PRENATAL MULTIVITAMIN CH
1.0000 | ORAL_TABLET | Freq: Every day | ORAL | Status: DC
  Administered 2013-09-02 – 2013-09-05 (×4): 1 via ORAL
  Filled 2013-09-02 (×4): qty 1

## 2013-09-02 NOTE — Progress Notes (Signed)
UR completed 

## 2013-09-02 NOTE — Progress Notes (Signed)
RN called second RN at bedside to adjust fetal cardio after RN at bedside for 15 minutes, fetal movement palpated, cardio tracing maternal, fetal heart tones found.

## 2013-09-02 NOTE — Progress Notes (Signed)
Pt sitting at side of bed after returning from restroom. Changing under buttocks pad and pt gown due to complaints of it being wet. FHR monitor and TOCO adjusted after pt back in bed at 0751.

## 2013-09-02 NOTE — Consult Note (Signed)
Neonatology Consult  Note:  At the request of the patients obstetrician Dr. Penne Lash I met with Jamie [name] who is at 12 3 wks currently with pregnancy complicated by  PPROM and PTL.  H/O short cervix treated with IM progesterone during her last pregnancy however has not been on progesterone this pregnancy.   She will receive her second dose of  betamethasone tonight and is on magnesium sulfate 2g/hr.  On latency antibiotics - ampicillin and erythromycin.    We reviewed initial delivery room management, including CPAP, [place], and low but certainly possible need for intubation for surfactant administration.  We discussed feeding immaturity and need for full po intake with multiple days of good weight gain and no apnea or bradycardia before discharge.  We reviewed increased risk of jaundice, infection, and temperature instability.   Discussed likely length of stay.  Thank you for allowing [place] to participate in her care.  Please call with questions.  John Giovanni, DO  Neonatologist  The total length of face-to-face or floor / unit time for this encounter was 20 minutes.  Counseling and / or coordination of care was greater than fifty percent of the time.

## 2013-09-02 NOTE — MAU Provider Note (Signed)
Jamie [name] is a 37 y.o. female [phone] with IUP at [redacted]w[redacted]d presenting for contraction and LOF. Pt states she has been having regular, every 3-5 minutes contractions, associated with no vaginal bleeding.  Membranes are ruptured, clear fluid, with active fetal movement.   [facility] 18.2 [facility]ks  H/O short cervix treated with IM progesterone during her last pregnancy. Recent US on 9/2 showed cervical length of 3.9cm. She reports no problems during this pregnancy and has not been on progesterone.   Prenatal History/Complications: none, see above  Past Medical History: Past Medical History  Diagnosis Date  . Abnormal Pap smear 2004    Past Surgical History: Past Surgical History  Procedure Laterality Date  . No past surgeries      Obstetrical History: OB History   Grav Para Term Preterm Abortions TAB SAB Ect Mult Living   4 2 2  0 1 0 1   2      Gynecological History: OB History   Grav Para Term Preterm Abortions TAB SAB Ect Mult Living   4 2 2  0 1 0 1   2      Social History: History   Social History  . Marital Status: Single    Spouse Name: N/A    Number of Children: N/A  . Years of Education: N/A   Social History Main Topics  . Smoking status: Never Smoker   . Smokeless tobacco: Never Used  . Alcohol Use: No  . Drug Use: No  . Sexual Activity: Not Currently    Birth Control/ Protection: None   Other Topics Concern  . None   Social History Narrative  . None    Family History: Family History  Problem Relation Age of Onset  . Hyperlipidemia Mother   . Hyperlipidemia Maternal Grandmother     Allergies: No Known Allergies  Prescriptions prior to admission  Medication Sig Dispense Refill  . Prenatal Multivit-Min-Fe-FA (PRENATAL VITAMINS) 0.8 MG tablet Take 1 tablet by mouth daily.  30 tablet  12     Review of Systems   Constitutional: Negative for fever, chills, weight loss, malaise/fatigue and diaphoresis.  HENT: Negative for hearing loss,  ear pain, nosebleeds, congestion, sore throat, neck pain, tinnitus and ear discharge.   Eyes: Negative for blurred vision, double vision, photophobia, pain, discharge and redness.  Respiratory: Negative for cough, hemoptysis, sputum production, shortness of breath, wheezing and stridor.   Cardiovascular: Negative for chest pain, palpitations, orthopnea,  leg swelling  Gastrointestinal: Positive for abdominal pain. Negative for heartburn, nausea, vomiting, diarrhea, constipation, blood in stool Genitourinary: Negative for dysuria, urgency, frequency, hematuria and flank pain.  Musculoskeletal: Negative for myalgias, back pain, joint pain and falls.  Skin: Negative for itching and rash.  Neurological: Negative for dizziness, tingling, tremors, sensory change, speech change, focal weakness, seizures, loss of consciousness, weakness and headaches.  Endo/Heme/Allergies: Negative for environmental allergies and polydipsia. Does not bruise/bleed easily.  Psychiatric/Behavioral: Negative for depression, suicidal ideas, hallucinations, memory loss and substance abuse. The patient is not nervous/anxious and does not have insomnia.       Blood pressure 104/73, pulse 91, temperature 98.2 F (36.8 C), resp. rate 18, height 5\' 2"  (1.575 m), weight 73.12 kg (161 lb 3.2 oz), last menstrual period 02/16/2013. General appearance: alert, cooperative, appears stated age and no distress Lungs: clear to auscultation bilaterally Heart: regular rate and rhythm Abdomen: soft, non-tender; bowel sounds normal Pelvic: + vaginal pooling Extremities: Homans sign is negative, no sign of DVT Presentation: cephalic  on US Fetal monitoringBaseline: 140 bpm +accels, - decel Uterine activityDate/time of onset: 09/02/2013 around 1500, Frequency: Every 3 minutes, Duration: 20 seconds and Intensity: moderate     Prenatal labs: ABO, Rh: O/POS/-- (06/24 1506) Antibody: NEG (06/24 1506) Rubella:   RPR: NON REAC (08/19 1040)   HBsAg: NEGATIVE (06/24 1506)  HIV: NON REACTIVE (08/19 1040)  GBS:   pending 1 hr Glucola 86 Genetic screening  AFP neg Anatomy US normal  FFN positive Amnisure positive  Assessment: Jamie [name] is a 37 y.o. [id number] with an IUP at [redacted]w[redacted]d presenting for PPROM  Plan: Admit to Antenatal for PPROM - Received 1L LR bolus and 1st dose of betamethasone in MAU - sent GBS culuture - continue NS with mag for total of 140ml/hr - Start mag with 6g bolus then 2g/hr - give 2nd dose of betamethasone in 24 hrs - continuous toco and FHT monitoring - send for limited US for cervical length - ampicillin and erythromycin for PPROM - NICU consult  Marissa Calamity, MD 09/02/2013, 12:48 AM  I have seen and examined this patient and I agree with the above. Cam Hai 7:20 AM 09/02/2013

## 2013-09-03 DIAGNOSIS — O4290 Premature rupture of membranes, unspecified as to length of time between rupture and onset of labor, unspecified weeks of gestation: Principal | ICD-10-CM

## 2013-09-03 DIAGNOSIS — O411090 Infection of amniotic sac and membranes, unspecified, unspecified trimester, not applicable or unspecified: Secondary | ICD-10-CM

## 2013-09-03 DIAGNOSIS — O42913 Preterm premature rupture of membranes, unspecified as to length of time between rupture and onset of labor, third trimester: Secondary | ICD-10-CM | POA: Diagnosis present

## 2013-09-03 DIAGNOSIS — O26879 Cervical shortening, unspecified trimester: Secondary | ICD-10-CM

## 2013-09-03 MED ORDER — DOCUSATE SODIUM 100 MG PO CAPS
100.0000 mg | ORAL_CAPSULE | Freq: Two times a day (BID) | ORAL | Status: DC
  Administered 2013-09-03 – 2013-09-05 (×5): 100 mg via ORAL
  Filled 2013-09-03 (×5): qty 1

## 2013-09-03 MED ORDER — POLYETHYLENE GLYCOL 3350 17 G PO PACK
17.0000 g | PACK | Freq: Every day | ORAL | Status: DC | PRN
  Filled 2013-09-03: qty 1

## 2013-09-03 NOTE — Progress Notes (Signed)
Pt instructed to get up to void

## 2013-09-03 NOTE — MAU Provider Note (Signed)
Attestation of Attending Supervision of Advanced Practitioner: Evaluation and management procedures were performed by the PA/NP/CNM/OB Fellow under my supervision/collaboration. Chart reviewed and agree with management and plan.  Tawna Alwin V 09/03/2013 10:20 AM

## 2013-09-03 NOTE — Progress Notes (Signed)
Patient ID: Jamie [name], female   DOB: Jun 13, 1976, 37 y.o.   MRN: [id number] FACULTY PRACTICE ANTEPARTUM(COMPREHENSIVE) NOTE  Jamie [name] is a 37 y.o. [id number] at [redacted]w[redacted]d by early ultrasound who is admitted for rupture of membranes.   Fetal presentation is cephalic. Length of Stay:  2  Days  Subjective: No complaints Patient reports the fetal movement as active. Patient reports uterine contraction  activity as none. Patient reports  vaginal bleeding as none. Patient describes fluid per vagina as Other small amount, less than at admission.  Vitals:  Blood pressure 92/52, pulse 96, temperature 98.3 F (36.8 C), temperature source Oral, resp. rate 18, height 5\' 2"  (1.575 m), weight 157 lb 12.8 oz (71.578 kg), last menstrual period 02/16/2013, SpO2 99.00%. Physical Examination:  General appearance - alert, well appearing, and in no distress Heart - normal rate and regular rhythm Abdomen - soft, nontender, nondistended Fundal Height:  size equals dates Cervical Exam: Not evaluated. and found to be hourglassing membranes by u.s.   Membranes:ruptured, by admitting provider direct visualization  Fetal Monitoring:  Baseline: 145 bpm no decels, no contraction  Labs:  No results found for this or any previous visit (from the past 24 hour(s)).  Imaging Studies:     Currently EPIC will not allow sonographic studies to automatically populate into notes.  In the meantime, copy and paste results into note or free text.  Medications:  Scheduled . ampicillin (OMNIPEN) IV  2 g Intravenous Q6H   Followed by  . [START ON 09/04/2013] amoxicillin  500 mg Oral Q8H  . docusate sodium  100 mg Oral Daily  . erythromycin  250 mg Intravenous Q6H   Followed by  . [START ON 09/04/2013] erythromycin  250 mg Oral Q6H  . prenatal multivitamin  1 tablet Oral Q1200   I have reviewed the patient's current medications.  ASSESSMENT: Patient Active Problem List   Diagnosis Date Noted  . Preterm premature  rupture of membranes in third trimester 31 wk 09/03/2013  . Posterior low lying placenta 06/09/2013  . Advanced maternal age, antepartum 06/02/2013    PLAN: INpatient care, antibiotics per protocol,  D/c mag sulfate now. Has completed BMZ x 2 Inpatient care til delivery at 34 wks or earlier prn Chorioamnionitis  Jamie [name] V 09/03/2013,10:11 AM

## 2013-09-04 ENCOUNTER — Inpatient Hospital Stay (HOSPITAL_COMMUNITY): Payer: Medicaid Other

## 2013-09-04 LAB — CULTURE, BETA STREP (GROUP B ONLY)

## 2013-09-04 MED ORDER — SODIUM CHLORIDE 0.9 % IJ SOLN
3.0000 mL | Freq: Three times a day (TID) | INTRAMUSCULAR | Status: DC
  Administered 2013-09-04 – 2013-09-05 (×4): 3 mL via INTRAVENOUS

## 2013-09-04 NOTE — Progress Notes (Signed)
MD notified of pt FHR strip.  MD reviewed and good to cont to monitor  Q4 hours

## 2013-09-04 NOTE — Progress Notes (Signed)
Pt sitting up in the bed eating breakfast  

## 2013-09-04 NOTE — Progress Notes (Signed)
Patient ID: Jamie [name], female   DOB: 1976-06-13, 37 y.o.   MRN: [id number] FACULTY PRACTICE ANTEPARTUM(COMPREHENSIVE) NOTE  Jamie [name] is a 37 y.o. [id number] at [redacted]w[redacted]d by early ultrasound who is admitted for preterm premature rupture of membranes (PPROM). Fetal presentation is cephalic. Length of Stay:  3  Days  Subjective: No complaints Patient reports the fetal movement as active. Patient reports uterine contraction  activity as none. Patient reports  vaginal bleeding as none. Patient describes fluid per vagina as very small amount, less than at admission.  Vitals:  Blood pressure 98/57, pulse 95, temperature 99 F (37.2 C), temperature source Oral, resp. rate 18, height 5\' 2"  (1.575 m), weight 157 lb 12.8 oz (71.578 kg), last menstrual period 02/16/2013, SpO2 99.00%. Physical Examination: General appearance - alert, well appearing, and in no distress Abdomen - soft, nontender, nondistended Fundal Height:  size equals dates Cervical Exam: Not evaluated. and found to be hourglassing membranes by u.s.   Membranes:ruptured, by admitting provider direct visualization  Fetal Monitoring:  Baseline: 145 bpm, +accels, no decels, no contractions  Labs:  No results found for this or any previous visit (from the past 24 hour(s)).  Imaging Studies:     Currently EPIC will not allow sonographic studies to automatically populate into notes.  In the meantime, copy and paste results into note or free text.  Medications:  Scheduled . amoxicillin  500 mg Oral Q8H  . docusate sodium  100 mg Oral BID  . erythromycin  250 mg Oral Q6H  . prenatal multivitamin  1 tablet Oral Q1200   I have reviewed the patient's current medications.  ASSESSMENT: Patient Active Problem List   Diagnosis Date Noted  . Preterm premature rupture of membranes in third trimester 31 wk 09/03/2013  . Advanced maternal age, antepartum 06/02/2013    PLAN: Continue inpatient care, latency antibiotics per  protocol,  Has completed BMZ x 2 and magnesium sulfate Formal ultrasound ordered today as per MFM recommendations Inpatient care until delivery at 34 wks or earlier prn chorioamnionitis  Tionne Dayhoff A, M.D. 09/04/2013,10:24 AM

## 2013-09-04 NOTE — Progress Notes (Signed)
Patient ID: Jamie [name], female   DOB: 05-19-1976, 37 y.o.   MRN: [id number] I received a call from MFM regarding pt.  AFI not measurable and fetus is now breech.  MFM has already counseled pt re mode and timing of delivery. I met with pt to confirm her understanding.    Pt denies contractions or abdominal pain.  She reports that she has not had leakage of fluid since last night.  She confirms that she understands that the fetus is breech.  She reports that she understands the risk of cord prolapse.  I explained to her that she should notify the nurse if she noted increased pelvic pressure, leaking or any blood per vagina.  I reviewed with her the risks of prematurity including ROP, NEC, IVH and fetal lung immaturity.    As per MFM we will do antenatal testing weekly in their unit and I will order NST with toco q 4 hours.  All questions were answered.  Of note, pt is aware that her delivery will be by c-section if she remains breech.  [name] L. Harraway-Smith, M.D., [facility]

## 2013-09-05 NOTE — Progress Notes (Signed)
Patient ID: Jamie [name], female   DOB: [birth year], 37 y.o.   MRN: [id number]  Pt had pink dinged spotting. Sterile speculum exam performed--pink tinged mucous.  No frank bleeding.  Cervix visually 1-2 cm.  No fetal parts extruding through cervix. Bedside US confirms breech. Will keep on continuous monitoring for know. No ctx currently.  FHT reassuring.   Abdomen is NT.

## 2013-09-05 NOTE — Progress Notes (Signed)
Patient ID: Jamie [name], female   DOB: Aug 25, 1976, 37 y.o.   MRN: [id number]  Maternal Diabetes: No  Genetic Screening: Normal  Maternal Ultrasounds/Referrals: Normal --LVEIF noted Fetal Ultrasounds or other Referrals: None  Maternal Substance Abuse: No  Significant Maternal Medications: None  Significant Maternal Lab Results: None  Other Comments: None

## 2013-09-05 NOTE — Progress Notes (Signed)
Patient ID: Jamie [name], female   DOB: 02-22-1976, 37 y.o.   MRN: [id number] ACULTY PRACTICE ANTEPARTUM COMPREHENSIVE PROGRESS NOTE  Jamie [name] is a 37 y.o. [id number] at [redacted]w[redacted]d  who is admitted for PROM.   Fetal presentation is breech. Length of Stay:  4  Days  Subjective: Pt reports continued leakage of clear fluid.  She denies increased pelvic pressure. Patient reports good fetal movement.  She reports no uterine contractions, no bleeding and no loss of fluid per vagina.  Vitals:  Blood pressure 90/51, pulse 83, temperature 97.9 F (36.6 C), temperature source Oral, resp. rate 14, height 5\' 2"  (1.575 m), weight 157 lb 12.8 oz (71.578 kg), last menstrual period 02/16/2013, SpO2 99.00%. Physical Examination: General appearance - alert, well appearing, and in no distress Abdomen - soft, nontender, nondistended, no masses or organomegaly gravid Extremities - peripheral pulses normal, no pedal edema, no clubbing or cyanosis Cervical Exam: Not evaluated.  Of note the fetus is breech with the feet in, but not through the cervix per MFM.  Extremities: extremities normal, atraumatic, no cyanosis or edema Membranes:intact, ruptured  Fetal Monitoring:  Baseline: 150's bpm, Variability: Good {> 6 bpm), Accelerations: Reactive and Decelerations: Variable: mild occ  Labs:  No results found for this or any previous visit (from the past 24 hour(s)).  Imaging Studies:    sono done yesterday AFI: not measurable.  BREECH (cord in upper abd well away from lower uterine segment   Medications:  Scheduled . amoxicillin  500 mg Oral Q8H  . docusate sodium  100 mg Oral BID  . erythromycin  250 mg Oral Q6H  . prenatal multivitamin  1 tablet Oral Q1200  . sodium chloride  3 mL Intravenous Q8H   I have reviewed the patient's current medications.  ASSESSMENT: Patient Active Problem List   Diagnosis Date Noted  . Preterm premature rupture of membranes in third trimester 31 wk 09/03/2013  .  Advanced maternal age, antepartum 06/02/2013    PLAN: Continue q 4 hour monitoring Deliver for s/sx of infection or other maternal or fetal condition Continue routine antenatal care. Weekly eval in MFM   HARRAWAY-SMITH, Shereese Bonnie 09/05/2013,7:51 AM

## 2013-09-06 ENCOUNTER — Encounter (HOSPITAL_COMMUNITY)
Admission: AD | Disposition: A | Discharge status: Home / Self Care | Payer: Self-pay | Source: Ambulatory Visit | Attending: Obstetrics & Gynecology

## 2013-09-06 ENCOUNTER — Encounter (HOSPITAL_COMMUNITY): Payer: Self-pay | Admitting: Anesthesiology

## 2013-09-06 ENCOUNTER — Inpatient Hospital Stay (HOSPITAL_COMMUNITY): Payer: Medicaid Other | Admitting: Anesthesiology

## 2013-09-06 DIAGNOSIS — O26879 Cervical shortening, unspecified trimester: Secondary | ICD-10-CM

## 2013-09-06 DIAGNOSIS — O4290 Premature rupture of membranes, unspecified as to length of time between rupture and onset of labor, unspecified weeks of gestation: Secondary | ICD-10-CM

## 2013-09-06 DIAGNOSIS — O411090 Infection of amniotic sac and membranes, unspecified, unspecified trimester, not applicable or unspecified: Secondary | ICD-10-CM

## 2013-09-06 HISTORY — PX: TUBAL LIGATION: SHX77

## 2013-09-06 LAB — ABO/RH: ABO/RH(D): O POS

## 2013-09-06 LAB — CBC
HCT: 34.2 % — ABNORMAL LOW (ref 36.0–46.0)
Hemoglobin: 11.2 g/dL — ABNORMAL LOW (ref 12.0–15.0)
MCH: 26.4 pg (ref 26.0–34.0)
MCHC: 32.7 g/dL (ref 30.0–36.0)
RBC: 4.24 MIL/uL (ref 3.87–5.11)

## 2013-09-06 LAB — TYPE AND SCREEN
ABO/RH(D): O POS
Antibody Screen: NEGATIVE

## 2013-09-06 SURGERY — Surgical Case
Anesthesia: Spinal | Site: Abdomen | Wound class: Clean Contaminated

## 2013-09-06 MED ORDER — LANOLIN HYDROUS EX OINT: 1.0000 "application " | TOPICAL_OINTMENT | CUTANEOUS | Status: DC | PRN

## 2013-09-06 MED ORDER — PRENATAL MULTIVITAMIN CH
1.0000 | ORAL_TABLET | Freq: Every day | ORAL | Status: DC
  Administered 2013-09-07 – 2013-09-08 (×2): 1 via ORAL
  Filled 2013-09-06 (×2): qty 1

## 2013-09-06 MED ORDER — SCOPOLAMINE 1 MG/3DAYS TD PT72
1.0000 | MEDICATED_PATCH | Freq: Once | TRANSDERMAL | Status: DC
  Administered 2013-09-06: 1.5 mg via TRANSDERMAL

## 2013-09-06 MED ORDER — ONDANSETRON HCL 4 MG/2ML IJ SOLN: 4.0000 mg | Freq: Three times a day (TID) | INTRAMUSCULAR | Status: DC | PRN

## 2013-09-06 MED ORDER — NALOXONE HCL 0.4 MG/ML IJ SOLN: 0.4000 mg | INTRAMUSCULAR | Status: DC | PRN

## 2013-09-06 MED ORDER — SCOPOLAMINE 1 MG/3DAYS TD PT72
MEDICATED_PATCH | TRANSDERMAL | Status: AC
  Filled 2013-09-06: qty 1

## 2013-09-06 MED ORDER — FENTANYL CITRATE 0.05 MG/ML IJ SOLN
INTRAMUSCULAR | Status: DC | PRN
  Administered 2013-09-06: 15 ug via INTRATHECAL

## 2013-09-06 MED ORDER — BUPIVACAINE IN DEXTROSE 0.75-8.25 % IT SOLN
INTRATHECAL | Status: DC | PRN
  Administered 2013-09-06: 1.5 mL via INTRATHECAL

## 2013-09-06 MED ORDER — ZOLPIDEM TARTRATE 5 MG PO TABS: 5.0000 mg | ORAL_TABLET | Freq: Every evening | ORAL | Status: DC | PRN

## 2013-09-06 MED ORDER — KETOROLAC TROMETHAMINE 30 MG/ML IJ SOLN: 30.0000 mg | Freq: Four times a day (QID) | INTRAMUSCULAR | Status: AC | PRN

## 2013-09-06 MED ORDER — DIPHENHYDRAMINE HCL 25 MG PO CAPS: 25.0000 mg | ORAL_CAPSULE | ORAL | Status: DC | PRN

## 2013-09-06 MED ORDER — NALBUPHINE HCL 10 MG/ML IJ SOLN
5.0000 mg | INTRAMUSCULAR | Status: DC | PRN
  Filled 2013-09-06: qty 1

## 2013-09-06 MED ORDER — SIMETHICONE 80 MG PO CHEW: 80.0000 mg | CHEWABLE_TABLET | ORAL | Status: DC | PRN

## 2013-09-06 MED ORDER — OXYTOCIN 40 UNITS IN LACTATED RINGERS INFUSION - SIMPLE MED: 62.5000 mL/h | INTRAVENOUS | Status: DC

## 2013-09-06 MED ORDER — HYDROMORPHONE HCL PF 1 MG/ML IJ SOLN: 0.2500 mg | INTRAMUSCULAR | Status: DC | PRN

## 2013-09-06 MED ORDER — LACTATED RINGERS IV SOLN
INTRAVENOUS | Status: DC | PRN
  Administered 2013-09-06 (×3): via INTRAVENOUS

## 2013-09-06 MED ORDER — MEPERIDINE HCL 25 MG/ML IJ SOLN
6.2500 mg | INTRAMUSCULAR | Status: DC | PRN
  Administered 2013-09-06: 6.25 mg via INTRAVENOUS

## 2013-09-06 MED ORDER — MORPHINE SULFATE 0.5 MG/ML IJ SOLN
INTRAMUSCULAR | Status: AC
  Filled 2013-09-06: qty 10

## 2013-09-06 MED ORDER — MEPERIDINE HCL 25 MG/ML IJ SOLN: 6.2500 mg | INTRAMUSCULAR | Status: DC | PRN

## 2013-09-06 MED ORDER — KETOROLAC TROMETHAMINE 60 MG/2ML IM SOLN
60.0000 mg | Freq: Once | INTRAMUSCULAR | Status: AC | PRN
  Administered 2013-09-06: 60 mg via INTRAMUSCULAR

## 2013-09-06 MED ORDER — OXYCODONE-ACETAMINOPHEN 5-325 MG PO TABS
1.0000 | ORAL_TABLET | ORAL | Status: DC | PRN
  Administered 2013-09-06 – 2013-09-07 (×2): 1 via ORAL
  Administered 2013-09-07: 2 via ORAL
  Filled 2013-09-06: qty 1
  Filled 2013-09-06 (×2): qty 2
  Filled 2013-09-06: qty 1

## 2013-09-06 MED ORDER — WITCH HAZEL-GLYCERIN EX PADS: 1.0000 "application " | MEDICATED_PAD | CUTANEOUS | Status: DC | PRN

## 2013-09-06 MED ORDER — MORPHINE SULFATE (PF) 0.5 MG/ML IJ SOLN
INTRAMUSCULAR | Status: DC | PRN
  Administered 2013-09-06: 2 mg via INTRAVENOUS
  Administered 2013-09-06: .9 mg via INTRAVENOUS
  Administered 2013-09-06: .1 mg via EPIDURAL
  Administered 2013-09-06: 2 mg via INTRAVENOUS

## 2013-09-06 MED ORDER — KETOROLAC TROMETHAMINE 60 MG/2ML IM SOLN
INTRAMUSCULAR | Status: AC
  Filled 2013-09-06: qty 2

## 2013-09-06 MED ORDER — MEPERIDINE HCL 25 MG/ML IJ SOLN
INTRAMUSCULAR | Status: AC
  Filled 2013-09-06: qty 1

## 2013-09-06 MED ORDER — IBUPROFEN 600 MG PO TABS
600.0000 mg | ORAL_TABLET | Freq: Four times a day (QID) | ORAL | Status: DC
  Administered 2013-09-06 – 2013-09-08 (×7): 600 mg via ORAL
  Filled 2013-09-06 (×7): qty 1

## 2013-09-06 MED ORDER — INFLUENZA VAC SPLIT QUAD 0.5 ML IM SUSP
0.5000 mL | INTRAMUSCULAR | Status: AC
  Administered 2013-09-07: 0.5 mL via INTRAMUSCULAR
  Filled 2013-09-06: qty 0.5

## 2013-09-06 MED ORDER — SENNOSIDES-DOCUSATE SODIUM 8.6-50 MG PO TABS
2.0000 | ORAL_TABLET | Freq: Every day | ORAL | Status: DC
  Administered 2013-09-06 – 2013-09-07 (×2): 2 via ORAL

## 2013-09-06 MED ORDER — 0.9 % SODIUM CHLORIDE (POUR BTL) OPTIME
TOPICAL | Status: DC | PRN
  Administered 2013-09-06: 1000 mL

## 2013-09-06 MED ORDER — LACTATED RINGERS IV SOLN: INTRAVENOUS | Status: DC

## 2013-09-06 MED ORDER — MAGNESIUM SULFATE 40 G IN LACTATED RINGERS - SIMPLE
2.0000 g/h | INTRAVENOUS | Status: DC
  Filled 2013-09-06: qty 500

## 2013-09-06 MED ORDER — PROMETHAZINE HCL 25 MG/ML IJ SOLN: 6.2500 mg | INTRAMUSCULAR | Status: DC | PRN

## 2013-09-06 MED ORDER — ONDANSETRON HCL 4 MG/2ML IJ SOLN: 4.0000 mg | INTRAMUSCULAR | Status: DC | PRN

## 2013-09-06 MED ORDER — MAGNESIUM SULFATE BOLUS VIA INFUSION
4.0000 g | Freq: Once | INTRAVENOUS | Status: AC
  Administered 2013-09-06: 4 g via INTRAVENOUS
  Filled 2013-09-06: qty 500

## 2013-09-06 MED ORDER — SODIUM CHLORIDE 0.9 % IV SOLN
INTRAVENOUS | Status: DC
  Administered 2013-09-06: 10:00:00 via INTRAVENOUS

## 2013-09-06 MED ORDER — OXYTOCIN 10 UNIT/ML IJ SOLN
INTRAMUSCULAR | Status: AC
  Filled 2013-09-06: qty 4

## 2013-09-06 MED ORDER — DIPHENHYDRAMINE HCL 50 MG/ML IJ SOLN: 12.5000 mg | INTRAMUSCULAR | Status: DC | PRN

## 2013-09-06 MED ORDER — MEASLES, MUMPS & RUBELLA VAC ~~LOC~~ INJ: 0.5000 mL | INJECTION | Freq: Once | SUBCUTANEOUS | Status: DC

## 2013-09-06 MED ORDER — DIPHENHYDRAMINE HCL 25 MG PO CAPS: 25.0000 mg | ORAL_CAPSULE | Freq: Four times a day (QID) | ORAL | Status: DC | PRN

## 2013-09-06 MED ORDER — SIMETHICONE 80 MG PO CHEW: 80.0000 mg | CHEWABLE_TABLET | ORAL | Status: DC

## 2013-09-06 MED ORDER — CITRIC ACID-SODIUM CITRATE 334-500 MG/5ML PO SOLN
ORAL | Status: AC
  Administered 2013-09-06: 30 mL
  Filled 2013-09-06: qty 15

## 2013-09-06 MED ORDER — PHENYLEPHRINE HCL 10 MG/ML IJ SOLN
INTRAMUSCULAR | Status: DC | PRN
  Administered 2013-09-06 (×2): 80 ug via INTRAVENOUS
  Administered 2013-09-06 (×2): 40 ug via INTRAVENOUS
  Administered 2013-09-06 (×2): 80 ug via INTRAVENOUS

## 2013-09-06 MED ORDER — SIMETHICONE 80 MG PO CHEW
80.0000 mg | CHEWABLE_TABLET | Freq: Three times a day (TID) | ORAL | Status: DC
  Administered 2013-09-06 – 2013-09-08 (×7): 80 mg via ORAL

## 2013-09-06 MED ORDER — NALOXONE HCL 1 MG/ML IJ SOLN
1.0000 ug/kg/h | INTRAVENOUS | Status: DC | PRN
  Filled 2013-09-06: qty 2

## 2013-09-06 MED ORDER — ONDANSETRON HCL 4 MG/2ML IJ SOLN
INTRAMUSCULAR | Status: AC
  Filled 2013-09-06: qty 2

## 2013-09-06 MED ORDER — OXYTOCIN 10 UNIT/ML IJ SOLN
40.0000 [IU] | INTRAMUSCULAR | Status: DC | PRN
  Administered 2013-09-06: 40 [IU] via INTRAVENOUS

## 2013-09-06 MED ORDER — MENTHOL 3 MG MT LOZG: 1.0000 | LOZENGE | OROMUCOSAL | Status: DC | PRN

## 2013-09-06 MED ORDER — FENTANYL CITRATE 0.05 MG/ML IJ SOLN
INTRAMUSCULAR | Status: AC
  Filled 2013-09-06: qty 2

## 2013-09-06 MED ORDER — ONDANSETRON HCL 4 MG/2ML IJ SOLN
INTRAMUSCULAR | Status: DC | PRN
  Administered 2013-09-06: 4 mg via INTRAVENOUS

## 2013-09-06 MED ORDER — DIBUCAINE 1 % RE OINT: 1.0000 "application " | TOPICAL_OINTMENT | RECTAL | Status: DC | PRN

## 2013-09-06 MED ORDER — DIPHENHYDRAMINE HCL 50 MG/ML IJ SOLN: 25.0000 mg | INTRAMUSCULAR | Status: DC | PRN

## 2013-09-06 MED ORDER — PHENYLEPHRINE 40 MCG/ML (10ML) SYRINGE FOR IV PUSH (FOR BLOOD PRESSURE SUPPORT)
PREFILLED_SYRINGE | INTRAVENOUS | Status: AC
  Filled 2013-09-06: qty 10

## 2013-09-06 MED ORDER — METOCLOPRAMIDE HCL 5 MG/ML IJ SOLN: 10.0000 mg | Freq: Three times a day (TID) | INTRAMUSCULAR | Status: DC | PRN

## 2013-09-06 MED ORDER — ONDANSETRON HCL 4 MG PO TABS: 4.0000 mg | ORAL_TABLET | ORAL | Status: DC | PRN

## 2013-09-06 MED ORDER — MAGNESIUM HYDROXIDE 400 MG/5ML PO SUSP: 30.0000 mL | ORAL | Status: DC | PRN

## 2013-09-06 MED ORDER — CEFAZOLIN SODIUM-DEXTROSE 2-3 GM-% IV SOLR
2.0000 g | Freq: Once | INTRAVENOUS | Status: AC
  Administered 2013-09-06: 2 g via INTRAVENOUS
  Filled 2013-09-06: qty 50

## 2013-09-06 MED ORDER — FENTANYL CITRATE 0.05 MG/ML IJ SOLN
INTRAMUSCULAR | Status: DC | PRN
  Administered 2013-09-06: 50 ug via INTRAVENOUS
  Administered 2013-09-06: 10 ug via INTRAVENOUS
  Administered 2013-09-06: 25 ug via INTRAVENOUS

## 2013-09-06 MED ORDER — SODIUM CHLORIDE 0.9 % IJ SOLN: 3.0000 mL | INTRAMUSCULAR | Status: DC | PRN

## 2013-09-06 MED ORDER — TETANUS-DIPHTH-ACELL PERTUSSIS 5-2.5-18.5 LF-MCG/0.5 IM SUSP: 0.5000 mL | Freq: Once | INTRAMUSCULAR | Status: DC

## 2013-09-06 SURGICAL SUPPLY — 34 items
BENZOIN TINCTURE PRP APPL 2/3 (GAUZE/BANDAGES/DRESSINGS) ×2 IMPLANT
CLAMP CORD UMBIL (MISCELLANEOUS) IMPLANT
CLIP FILSHIE TUBAL LIGA STRL (Clip) ×4 IMPLANT
CLOTH BEACON ORANGE TIMEOUT ST (SAFETY) ×2 IMPLANT
DRAPE LG THREE QUARTER DISP (DRAPES) ×4 IMPLANT
DRSG OPSITE POSTOP 4X10 (GAUZE/BANDAGES/DRESSINGS) ×2 IMPLANT
DURAPREP 26ML APPLICATOR (WOUND CARE) ×2 IMPLANT
ELECT REM PT RETURN 9FT ADLT (ELECTROSURGICAL) ×2
ELECTRODE REM PT RTRN 9FT ADLT (ELECTROSURGICAL) ×1 IMPLANT
EXTRACTOR VACUUM KIWI (MISCELLANEOUS) IMPLANT
GLOVE BIO SURGEON ST LM GN SZ9 (GLOVE) ×2 IMPLANT
GLOVE BIOGEL PI IND STRL 9 (GLOVE) ×1 IMPLANT
GLOVE BIOGEL PI INDICATOR 9 (GLOVE) ×1
GOWN PREVENTION PLUS XLARGE (GOWN DISPOSABLE) ×2 IMPLANT
GOWN STRL REIN 3XL LVL4 (GOWN DISPOSABLE) ×2 IMPLANT
GOWN STRL REIN XL XLG (GOWN DISPOSABLE) ×2 IMPLANT
NEEDLE HYPO 25X5/8 SAFETYGLIDE (NEEDLE) ×2 IMPLANT
NS IRRIG 1000ML POUR BTL (IV SOLUTION) ×2 IMPLANT
PACK C SECTION WH (CUSTOM PROCEDURE TRAY) ×2 IMPLANT
PAD OB MATERNITY 4.3X12.25 (PERSONAL CARE ITEMS) ×2 IMPLANT
RETRACTOR WND ALEXIS 25 LRG (MISCELLANEOUS) IMPLANT
RTRCTR C-SECT PINK 25CM LRG (MISCELLANEOUS) IMPLANT
RTRCTR WOUND ALEXIS 25CM LRG (MISCELLANEOUS)
STRIP CLOSURE SKIN 1/2X4 (GAUZE/BANDAGES/DRESSINGS) ×2 IMPLANT
SUT CHROMIC 0 CTX 36 (SUTURE) ×6 IMPLANT
SUT VIC AB 0 CT1 27 (SUTURE) ×1
SUT VIC AB 0 CT1 27XBRD ANBCTR (SUTURE) ×1 IMPLANT
SUT VIC AB 2-0 CT1 27 (SUTURE) ×2
SUT VIC AB 2-0 CT1 TAPERPNT 27 (SUTURE) ×2 IMPLANT
SUT VIC AB 4-0 KS 27 (SUTURE) ×2 IMPLANT
SYR BULB IRRIGATION 50ML (SYRINGE) IMPLANT
TOWEL OR 17X24 6PK STRL BLUE (TOWEL DISPOSABLE) ×2 IMPLANT
TRAY FOLEY CATH 14FR (SET/KITS/TRAYS/PACK) ×2 IMPLANT
WATER STERILE IRR 1000ML POUR (IV SOLUTION) ×2 IMPLANT

## 2013-09-06 NOTE — OR Nursing (Signed)
Filshie clips applied to right and left fallopian tubes by Dr. Sherryl Manges on 09/06/2013. Lot number-[id number]. Expiration date-02/2016. Manufacture - [employer].

## 2013-09-06 NOTE — Consult Note (Signed)
Neonatology Note:   Attendance at C-section:    I was asked by Dr. Emelda Fear to attend this primary C/S at 32 0/[redacted] weeks GA due to breech presentation and NRFHR in the presence of PPROM and low AFI. The mother is a G4P2A1 O pos, GBS neg with SROM on 9/23. She received 2 doses of Betamethasone on 9/24-25, Magnesium sulfate for neuroprotection, and Ampicillin and Erythromycin. Fetal ultrasound had shown an echogenic intracardiac focus at 28 weeks. ROM 5 days prior to delivery, fluid clear. The mother was afebrile during labor. At delivery, infant vigorous with good spontaneous cry and tone. Needed only minimal bulb suctioning. Ap 9/9. Lungs clear to ausc in DR. A pulse oximeter was placed and he had normal O2 saturations throughout, in room air. Shown to mother briefly in OR, then transported to the NICU for further care, with his father in attendance.   Doretha Sou, MD

## 2013-09-06 NOTE — Progress Notes (Signed)
Magnesium sulfate decreased to 2gm/hr.

## 2013-09-06 NOTE — Op Note (Signed)
Attestation of Attending Supervision of Advanced Practitioner: Evaluation and management procedures were performed by the PA/NP/CNM/OB Fellow under my supervision/collaboration. Chart reviewed and agree with management and plan.  Brionne Mertz V 09/06/2013 6:45 PM

## 2013-09-06 NOTE — Anesthesia Procedure Notes (Addendum)
Spinal  Patient location during procedure: OR Start time: 09/06/2013 11:12 AM End time: 09/06/2013 11:15 AM Staffing Anesthesiologist: Lewie Loron R Performed by: anesthesiologist  Preanesthetic Checklist Completed: patient identified, surgical consent, pre-op evaluation, timeout performed, IV checked, risks and benefits discussed and monitors and equipment checked Spinal Block Patient position: sitting Prep: DuraPrep Patient monitoring: continuous pulse ox, blood pressure and heart rate Approach: midline Location: L2-3 Injection technique: single-shot Needle Needle type: Sprotte  Needle gauge: 24 G Needle length: 9 cm Assessment Sensory level: T6 Events: paresthesia Additional Notes Expiration date of kit checked and confirmed. Patient tolerated procedure well, without complications.

## 2013-09-06 NOTE — Op Note (Signed)
Jamie [name] PROCEDURE DATE: 09/06/2013  PREOPERATIVE DIAGNOSES: Intrauterine pregnancy at  110w0d weeks gestation; malpresentation: breech, PPROM, Uterine irritability; undesired fertility  POSTOPERATIVE DIAGNOSES: The same  PROCEDURE: Primary Low Transverse Cesarean Section, Bilateral Tubal Sterilization using Filshie clips  SURGEON:  Dr. Christin Bach  ASSISTANT:  Dr. Rulon Abide  INDICATIONS: Jamie [name] is a 37 y.o. [id number] at [redacted]w[redacted]d here for cesarean section and bilateral tubal sterilization secondary to the indications listed under preoperative diagnoses; please see preoperative note for further details.  The risks of surgery were discussed with the patient including but were not limited to: bleeding which may require transfusion or reoperation; infection which may require antibiotics; injury to bowel, bladder, ureters or other surrounding organs; injury to the fetus; need for additional procedures including hysterectomy in the event of a life-threatening hemorrhage; placental abnormalities wth subsequent pregnancies, incisional problems, thromboembolic phenomenon and other postoperative/anesthesia complications.  Patient also desires permanent sterilization.  Other reversible forms of contraception were discussed with patient; she declines all other modalities. Risks of procedure discussed with patient including but not limited to: risk of regret, permanence of method, bleeding, infection, injury to surrounding organs and need for additional procedures.  Failure risk of 1-2% with increased risk of ectopic gestation if pregnancy occurs was also discussed with patient.  The patient concurred with the proposed plan, giving informed written consent for the procedures.    FINDINGS:  Viable female infant in breech presentation.  Apgars 9 and 9.  No amniotic fluid.  Intact placenta, three vessel cord.  Normal uterus, fallopian tubes and ovaries bilaterally. Fallopian tubes sterilized with Filshie  clips bilaterally. Right fallopian tube with ?adhesions vs. Embryologic variation.   ANESTHESIA: Spinal INTRAVENOUS FLUIDS: 3200 ml ESTIMATED BLOOD LOSS: 800 ml URINE OUTPUT:  550 ml SPECIMENS: Placenta sent to L&D COMPLICATIONS: None immediate  PROCEDURE IN DETAIL:  The patient preoperatively received intravenous antibiotics and had sequential compression devices applied to her lower extremities.   She was then taken to the operating room where spinal anesthesia was administered and was found to be adequate. She was then placed in a dorsal supine position with a leftward tilt, and prepped and draped in a sterile manner.  A foley catheter was placed into her bladder and attached to constant gravity.  After an adequate timeout was performed, a Pfannenstiel skin incision was made with scalpel and carried through to the underlying layer of fascia. The fascia was incised in the midline, and this incision was extended bilaterally using the Mayo scissors.  Kocher clamps were applied to the superior aspect of the fascial incision and the underlying rectus muscles were dissected off bluntly. A similar process was carried out on the inferior aspect of the fascial incision. The rectus muscles were separated in the midline bluntly and the peritoneum was entered bluntly. Attention was turned to the lower uterine segment where a low transverse hysterotomy was made with a scalpel and extended bilaterally bluntly.  The infant was successfully delivered breech, spontaneous cry, the cord was clamped and cut and the infant was handed over to awaiting neonatology team. Uterine massage was then administered, and the placenta delivered intact with a three-vessel cord. Cord gas was sent. The uterus was then cleared of clot and debris.  The hysterotomy was closed with 0 chromic in a running locked fashion, and an imbricating layer was also placed with 0 chromic.  Attention was then turned to the fallopian tubes, about 3 cm from  the cornua, with care given to incorporate the  underlying mesosalpinx on both sides, allowing for bilateral tubal sterilization. Filshie clips were applied without difficulty. The right adnexa had the appearance of either significant adhesions or embryologic anomaly. The pelvis was cleared of all clot and debris. Hemostasis was confirmed on all surfaces.  The peritoneum was reapproximated using 2 Vicryl running stitches. The rectus muscles were also re approximated with 2-0 vicryl with an inturrupted stitch. The fascia was then closed using 0 Vicryl in a running fashion.  The subcutaneous layer was irrigated.  The skin was closed with a 4-0 Vicryl subcuticular stitch. The patient tolerated the procedure well. Sponge, lap, instrument and needle counts were correct x 2.  She was taken to the recovery room in stable condition.

## 2013-09-06 NOTE — OR Nursing (Addendum)
Uterus massaged by S. Terriah Reggio Charity fundraiser.  100 of blood evacuated from uterus during uterine massage. Two tubes of cord blood sent to lab.

## 2013-09-06 NOTE — Anesthesia Postprocedure Evaluation (Signed)
Anesthesia Post Note  Patient: Jamie [name]  Procedure(s) Performed: Procedure(s) (LRB): Primary cesarean section with delivery of baby boy at 14.  Apgars 9/9. (N/A)  Anesthesia type: Spinal  Patient location: PACU  Post pain: Pain level controlled  Post assessment: Post-op Vital signs reviewed  Last Vitals: BP 101/68  Pulse 84  Temp(Src) 36.9 C (Oral)  Resp 27  Ht 5\' 2"  (1.575 m)  Wt 157 lb 12.8 oz (71.578 kg)  BMI 28.85 kg/m2  SpO2 100%  LMP 02/16/2013  Post vital signs: Reviewed  Level of consciousness: sedated  Complications: No apparent anesthesia complications

## 2013-09-06 NOTE — Transfer of Care (Signed)
Immediate Anesthesia Transfer of Care Note  Patient: Jamie [name]  Procedure(s) Performed: Procedure(s): Primary cesarean section with delivery of baby boy at 44.  Apgars 9/9. (N/A)  Patient Location: PACU  Anesthesia Type:Spinal  Level of Consciousness: alert   Airway & Oxygen Therapy: Patient Spontanous Breathing  Post-op Assessment: Report given to PACU RN  Post vital signs: Reviewed and stable  Complications: No apparent anesthesia complications

## 2013-09-06 NOTE — Anesthesia Preprocedure Evaluation (Signed)
Anesthesia Evaluation  Patient identified by MRN, date of birth, ID band Patient awake    Reviewed: Allergy & Precautions, H&P , NPO status , Patient's Chart, lab work & pertinent test results  Airway Mallampati: II TM Distance: >3 FB Neck ROM: Full    Dental  (+) Dental Advisory Given   Pulmonary neg pulmonary ROS,          Cardiovascular negative cardio ROS  Rhythm:Regular     Neuro/Psych negative neurological ROS  negative psych ROS   GI/Hepatic negative GI ROS, Neg liver ROS,   Endo/Other  negative endocrine ROS  Renal/GU negative Renal ROS     Musculoskeletal negative musculoskeletal ROS (+)   Abdominal   Peds  Hematology negative hematology ROS (+)   Anesthesia Other Findings   Reproductive/Obstetrics (+) Pregnancy                           Anesthesia Physical Anesthesia Plan  ASA: II  Anesthesia Plan: Spinal   Post-op Pain Management:    Induction:   Airway Management Planned:   Additional Equipment:   Intra-op Plan:   Post-operative Plan:   Informed Consent: I have reviewed the patients History and Physical, chart, labs and discussed the procedure including the risks, benefits and alternatives for the proposed anesthesia with the patient or authorized representative who has indicated his/her understanding and acceptance.     Plan Discussed with: CRNA  Anesthesia Plan Comments:         Anesthesia Quick Evaluation

## 2013-09-06 NOTE — Progress Notes (Signed)
Patient ID: Jamie [name], female   DOB: Jun 09, 1976, 37 y.o.   MRN: [id number] FACULTY PRACTICE ANTEPARTUM(COMPREHENSIVE) NOTE  Jamie [name] is a 37 y.o. [id number] at [redacted]w[redacted]d by early ultrasound who is admitted for rupture of membranes.   Fetal presentation is breech. Length of Stay:  5  Days  Subjective: Pt  Reports having more and more abd tightening.  Last afternoon was experiencing slight show, and cx exam reported as 11-2 cm by K.Leggett MD. Patient reports the fetal movement as active. Patient reports uterine contraction  activity as irregular, every 10-15 minutes. Patient reports  vaginal bleeding as spotting. Patient describes fluid per vagina as Clear.  Vitals:  Blood pressure 105/60, pulse 114, temperature 98.7 F (37.1 C), temperature source Oral, resp. rate 18, height 5\' 2"  (1.575 m), weight 71.578 kg (157 lb 12.8 oz), last menstrual period 02/16/2013, SpO2 99.00%. Physical Examination:  General appearance - alert, well appearing, and in no distress Heart - normal rate and regular rhythm Abdomen - soft, nontender, nondistended Fundal Height:  size equals dates Cervical Exam: Not evaluated. and yesterdays exam showed cx 1-2 cm Extremities: extremities normal, atraumatic, no cyanosis or edema and Homans sign is negative, no sign of DVT with DTRs 2+ bilaterally Membranes:intact, ruptured  Fetal Monitoring:  Baseline: 150 bpm reactive , with prolonged decels to 90's having occurred several times thru the night,  Category I between the contractions.  Labs: Type and screen, cbc ordered Imaging Studies:    Ultrasound + breech yesterday. Will confirm Medications:  Scheduled . amoxicillin  500 mg Oral Q8H  . citric acid-sodium citrate      . docusate sodium  100 mg Oral BID  . erythromycin  250 mg Oral Q6H  . prenatal multivitamin  1 tablet Oral Q1200  . sodium chloride  3 mL Intravenous Q8H   I have reviewed the patient's current medications.  ASSESSMENT: Patient Active  Problem List   Diagnosis Date Noted  . Preterm premature rupture of membranes in third trimester 31 wk 09/03/2013  . Advanced maternal age, antepartum 06/02/2036  Intermittent prolonged decels, with progressive uterine irritability Breech presentation   PLAN: Recommend proceeding with Cesarean delivery , give n patient's recurrent prolonged decels, and increasing uterine irritability. Additionally , patient desires to proceed with permanent sterilization at this time. Patient has signed sterilization  request at prior office visits.Patient understands the inherent risk of surgery , risk of bleeding , infection, injury to adjacent organs, infection. Will restart magnesium sulfate for neuroprophylaxis.   Jamie [name] 09/06/2013,9:02 AM

## 2013-09-07 LAB — CBC
HCT: 27.2 % — ABNORMAL LOW (ref 36.0–46.0)
MCHC: 33.8 g/dL (ref 30.0–36.0)
RDW: 14.6 % (ref 11.5–15.5)

## 2013-09-07 NOTE — Anesthesia Postprocedure Evaluation (Signed)
  Anesthesia Post-op Note  Patient: Jamie [name]  Procedure(s) Performed: Procedure(s): Primary cesarean section with delivery of baby boy at 9.  Apgars 9/9. (N/A)  Patient Location: [facility]  Anesthesia Type:Spinal  Level of Consciousness: awake  Airway and Oxygen Therapy: Patient Spontanous Breathing  Post-op Pain: mild  Post-op Assessment: Patient's Cardiovascular Status Stable and Respiratory Function Stable  Post-op Vital Signs: stable  Complications: No apparent anesthesia complications

## 2013-09-07 NOTE — Progress Notes (Signed)
Ur chart review completed.  

## 2013-09-07 NOTE — Progress Notes (Signed)
Subjective: Postpartum Day 1: Cesarean Delivery Patient reports tolerating PO.  Pt has not had flatus yet. No n/v.  Foley out recently, pt has not yet voided.    Objective: Vital signs in last 24 hours: Temp:  [97.7 F (36.5 C)-98.7 F (37.1 C)] 98 F (36.7 C) (09/29 0800) Pulse Rate:  [76-157] 90 (09/29 0800) Resp:  [15-27] 20 (09/29 0800) BP: (80-106)/(46-69) 80/46 mmHg (09/29 0800) SpO2:  [95 %-100 %] 100 % (09/29 0800)  Physical Exam:  General: alert and no distress Lochia: appropriate Uterine Fundus: firm Incision: dressing in place and dry DVT Evaluation: No evidence of DVT seen on physical exam.   Recent Labs  09/06/13 0903 09/07/13 0508  HGB 11.2* 9.2*  HCT 34.2* 27.2*    Assessment/Plan: Status post Cesarean section. Doing well postoperatively.  Continue current care.  HARRAWAY-SMITH, Glayds [place] 09/07/2013, 10:01 AM

## 2013-09-07 NOTE — H&P (Signed)
COPIED AND PASTED FROM MAU NOTE FROM 9/24 ALREADY SIGNED BY ATTENDING BUT NEEDS A H&P IN SYSTEM.  Tawana Scale, MD OB Fellow   Jamie [name] is a 37 y.o. female [id number] with IUP at [redacted]w[redacted]d presenting for contraction and LOF. Pt states she has been having regular, every 3-5 minutes contractions, associated with no vaginal bleeding. Membranes are ruptured, clear fluid, with active fetal movement.  [facility] at [facility] 18.2 wks  H/O short cervix treated with IM progesterone during her last pregnancy. Recent US on 9/2 showed cervical length of 3.9cm. She reports no problems during this pregnancy and has not been on progesterone.  Prenatal History/Complications: none, see above  Past Medical History:  Past Medical History   Diagnosis  Date   .  Abnormal Pap smear  2004    Past Surgical History:  Past Surgical History   Procedure  Laterality  Date   .  No past surgeries      Obstetrical History:  OB History    Grav  Para  Term  Preterm  Abortions  TAB  SAB  Ect  Mult  Living    4  2  2   0  1  0  1    2      Gynecological History:  OB History    Grav  Para  Term  Preterm  Abortions  TAB  SAB  Ect  Mult  Living    4  2  2   0  1  0  1    2      Social History:  History    Social History   .  Marital Status:  Single     Spouse Name:  N/A     Number of Children:  N/A   .  Years of Education:  N/A    Social History Main Topics   .  Smoking status:  Never Smoker   .  Smokeless tobacco:  Never Used   .  Alcohol Use:  No   .  Drug Use:  No   .  Sexual Activity:  Not Currently     Birth Control/ Protection:  None    Other Topics  Concern   .  None    Social History Narrative   .  None    Family History:  Family History   Problem  Relation  Age of Onset   .  Hyperlipidemia  Mother    .  Hyperlipidemia  Maternal Grandmother     Allergies:  No Known Allergies  Prescriptions prior to admission   Medication  Sig  Dispense  Refill   .  Prenatal Multivit-Min-Fe-FA  (PRENATAL VITAMINS) 0.8 MG tablet  Take 1 tablet by mouth daily.  30 tablet  12    Review of Systems  Constitutional: Negative for fever, chills, weight loss, malaise/fatigue and diaphoresis.  HENT: Negative for hearing loss, ear pain, nosebleeds, congestion, sore throat, neck pain, tinnitus and ear discharge.  Eyes: Negative for blurred vision, double vision, photophobia, pain, discharge and redness.  Respiratory: Negative for cough, hemoptysis, sputum production, shortness of breath, wheezing and stridor.  Cardiovascular: Negative for chest pain, palpitations, orthopnea, leg swelling  Gastrointestinal: Positive for abdominal pain. Negative for heartburn, nausea, vomiting, diarrhea, constipation, blood in stool  Genitourinary: Negative for dysuria, urgency, frequency, hematuria and flank pain.  Musculoskeletal: Negative for myalgias, back pain, joint pain and falls.  Skin: Negative for itching and rash.  Neurological: Negative for dizziness, tingling, tremors,  sensory change, speech change, focal weakness, seizures, loss of consciousness, weakness and headaches.  Endo/Heme/Allergies: Negative for environmental allergies and polydipsia. Does not bruise/bleed easily.  Psychiatric/Behavioral: Negative for depression, suicidal ideas, hallucinations, memory loss and substance abuse. The patient is not nervous/anxious and does not have insomnia.  Blood pressure 104/73, pulse 91, temperature 98.2 F (36.8 C), resp. rate 18, height 5\' 2"  (1.575 m), weight 73.12 kg (161 lb 3.2 oz), last menstrual period 02/16/2013.  General appearance: alert, cooperative, appears stated age and no distress  Lungs: clear to auscultation bilaterally  Heart: regular rate and rhythm  Abdomen: soft, non-tender; bowel sounds normal  Pelvic: + vaginal pooling  Extremities: Homans sign is negative, no sign of DVT  Presentation: cephalic on US  Fetal monitoringBaseline: 140 bpm +accels, - decel  Uterine activityDate/time of  onset: 09/02/2013 around 1500, Frequency: Every 3 minutes, Duration: 20 seconds and Intensity: moderate   Prenatal labs:  ABO, Rh: O/POS/-- (06/24 1506)  Antibody: NEG (06/24 1506)  Rubella:  RPR: NON REAC (08/19 1040)  HBsAg: NEGATIVE (06/24 1506)  HIV: NON REACTIVE (08/19 1040)  GBS: pending  1 hr Glucola 86  Genetic screening AFP neg  Anatomy US normal  FFN positive  Amnisure positive  Assessment:  Jamie [name] is a 37 y.o. [id number] with an IUP at [redacted]w[redacted]d presenting for PPROM  Plan:  Admit to Antenatal for PPROM  - Received 1L LR bolus and 1st dose of betamethasone in MAU  - sent GBS culuture  - continue NS with mag for total of 128ml/hr  - Start mag with 6g bolus then 2g/hr  - give 2nd dose of betamethasone in 24 hrs  - continuous toco and FHT monitoring  - send for limited US for cervical length  - ampicillin and erythromycin for PPROM  - NICU consult  Marissa Calamity, MD  09/02/2013, 12:48 AM  I have seen and examined this patient and I agree with the above.  Cam Hai  7:20 AM  09/02/2013

## 2013-09-08 ENCOUNTER — Encounter (HOSPITAL_COMMUNITY): Payer: Self-pay | Admitting: Obstetrics and Gynecology

## 2013-09-08 ENCOUNTER — Inpatient Hospital Stay (HOSPITAL_COMMUNITY)
Admission: AD | Admit: 2013-09-08 | Discharge: 2013-09-08 | Disposition: A | Discharge status: Home / Self Care | Payer: Medicaid Other | Source: Ambulatory Visit | Attending: Obstetrics & Gynecology | Admitting: Obstetrics & Gynecology

## 2013-09-08 ENCOUNTER — Ambulatory Visit: Payer: Self-pay

## 2013-09-08 DIAGNOSIS — O9279 Other disorders of lactation: Secondary | ICD-10-CM

## 2013-09-08 MED ORDER — IBUPROFEN 600 MG PO TABS: 600.0000 mg | ORAL_TABLET | Freq: Four times a day (QID) | ORAL | Status: DC

## 2013-09-08 MED ORDER — LANOLIN HYDROUS EX OINT: 1.0000 "application " | TOPICAL_OINTMENT | CUTANEOUS | Status: DC | PRN

## 2013-09-08 MED ORDER — OXYCODONE-ACETAMINOPHEN 5-325 MG PO TABS: 1.0000 | ORAL_TABLET | ORAL | Status: DC | PRN

## 2013-09-08 NOTE — Discharge Summary (Signed)
Obstetric Discharge Summary Reason for Admission: rupture of membranes and breech presentation Prenatal Procedures: NST and ultrasound Intrapartum Procedures: cesarean: low cervical, transverse and tubal ligation Postpartum Procedures: none Complications-Operative and Postpartum: none Hemoglobin  Date Value Range Status  09/07/2013 9.2* 12.0 - 15.0 g/dL Final     REPEATED TO VERIFY     DELTA CHECK NOTED     HCT  Date Value Range Status  09/07/2013 27.2* 36.0 - 46.0 % Final   Pt was admitted 9/24 AT [redacted] weeks gestation with PROM.  A sonogram done 9/26 revealed breech presentation.  On 9/27, pt began to have nonreassuring fetal assessment and a c-section with BTL was done.  Her post op course has been uncomplicated.  She is eating and passing flatus.  She denies N/V and requests to be discharged.  She is aware that her infant will be staying in the NICU.   Physical Exam:  General: alert and no distress Lochia: appropriate Uterine Fundus: firm Incision: healing well, dressing removed; incision clean/dry and intact DVT Evaluation: No evidence of DVT seen on physical exam.  Discharge Diagnoses: PROM x>72 hours hours and preterm delivery of breech infant  Discharge Information: Date: 09/08/2013 Activity: pelvic rest and no heavy lifting Diet: routine Medications: Ibuprofen and Percocet Condition: stable Instructions: refer to practice specific booklet Discharge to: home Follow-up Information   Follow up with [facility] In 4 weeks.      Newborn Data: Live born female  Birth Weight: 4 lb 1.3 oz (1850 g) APGAR: 9, 9  Will stay in NICU  [facility], Odies Desa 09/08/2013, 8:56 AM

## 2013-09-08 NOTE — MAU Note (Signed)
Pt felt weak and started shivering in the parking lot

## 2013-09-08 NOTE — Progress Notes (Signed)
Patient's condition is stable. Ambulated with family to NICU. No equipment taken home.

## 2013-09-08 NOTE — Lactation Note (Signed)
This note was copied from the chart of Jamie Karalina Tift. Lactation Consultation Note     Initial consult with this mom of a NICU baby, now 44 hours post partum, and 32 2/7 weeks corrected gestation. Mom has decided to formula feed. i briefly reviewed the benefits of breast milk with mom, especially for a premature baby, and told mom to let me know if she changes her mind and wants to provide breast milk for her baby.  Patient Name: Jamie [name] Date: 09/08/2013     Maternal Data    Feeding Feeding Type: Formula Length of feed: 30 min  LATCH Score/Interventions                      Lactation Tools Discussed/Used     Consult Status      Alfred Levins 09/08/2013, 4:13 PM

## 2013-09-08 NOTE — MAU Note (Signed)
Called by security to assist pt in parking lot, pt was already in a wheelchair, states she was on her way to NICU to see her baby and felt weak and started shivering. Pt states she is not having pain, denies nausea, vomiting, diarrhea. States bleeding is minimal.

## 2013-09-08 NOTE — MAU Provider Note (Signed)
History     CSN: [id number]  Arrival date and time: 09/08/13 2208   First Provider Initiated Contact with Patient 09/08/13 2259      Chief Complaint  Patient presents with  . Shaking  . Weakness   HPI  Jamie [name] is a 37 y.o. [id number] who is 2 days s/p c-section, and was dc home today. She states that she was walking in to go to the NICU and she got the chills. She states that she is feeling better now. She denies any fever or pain. She is not breastfeeding. She does feel her milk coming in today. She states that her bleeding has decreased and seems normal to her. She states that it is not malodorous.   Past Medical History  Diagnosis Date  . Abnormal Pap smear 2004    Past Surgical History  Procedure Laterality Date  . No past surgeries    . Cesarean section N/A 09/06/2013    Procedure: Primary cesarean section with delivery of baby boy at 1136.  Apgars 9/9.;  Surgeon: Tilda Burrow, MD;  Location: [facility];  Service: Obstetrics;  Laterality: N/A;    Family History  Problem Relation Age of Onset  . Hyperlipidemia Mother   . Hyperlipidemia Maternal Grandmother     History  Substance Use Topics  . Smoking status: Never Smoker   . Smokeless tobacco: Never Used  . Alcohol Use: No    Allergies: No Known Allergies  Prescriptions prior to admission  Medication Sig Dispense Refill  . ibuprofen (ADVIL,MOTRIN) 600 MG tablet Take 1 tablet (600 mg total) by mouth every 6 (six) hours.  30 tablet  0  . lanolin OINT Apply 1 application topically as needed (for breast care).  30 g  0  . oxyCODONE-acetaminophen (PERCOCET/ROXICET) 5-325 MG per tablet Take 1-2 tablets by mouth every 4 (four) hours as needed.  30 tablet  0  . Prenatal Vit-Fe Fumarate-FA (PRENATAL MULTIVITAMIN) TABS tablet Take 1 tablet by mouth daily at 12 noon.        ROS Physical Exam   Blood pressure 121/76, pulse 118, temperature 99.7 F (37.6 C), temperature source Oral, resp. rate 20, last menstrual  period 02/16/2013, SpO2 100.00%, not currently breastfeeding.  Physical Exam  Nursing note and vitals reviewed. Constitutional: She is oriented to person, place, and time. She appears well-developed and well-nourished. No distress.  Cardiovascular: Normal rate.   Respiratory: Effort normal.  GI: Soft. There is no tenderness. There is no rebound.  Genitourinary:   c-sectio incision: C/D/I, honeycomb dressing intact Fundus: firm 2 below U, non-tender Bleeding normal, no odor.   Neurological: She is alert and oriented to person, place, and time.  Skin: Skin is warm and dry.  Psychiatric: She has a normal mood and affect.    MAU Course  Procedures  Results for orders placed during the hospital encounter of 09/08/13 (from the past 24 hour(s))  CBC     Status: Abnormal   Collection Time    09/08/13 10:30 PM      Result Value Range   WBC 14.3 (*) 4.0 - 10.5 K/uL   RBC 3.65 (*) 3.87 - 5.11 MIL/uL   Hemoglobin 9.8 (*) 12.0 - 15.0 g/dL   HCT 14.7 (*) 82.9 - 56.2 %   MCV 82.2  78.0 - 100.0 fL   MCH 26.8  26.0 - 34.0 pg   MCHC 32.7  30.0 - 36.0 g/dL   RDW 13.0  86.5 - 78.4 %   Platelets  111 (*) 150 - 400 K/uL     Assessment and Plan   1. Engorgement of breasts, postpartum, following discharge from delivery    Discussed breast care and danger signs of mastitis FU with the office as planned Return to MAU as needed   Tawnya Crook 09/08/2013, 11:02 PM

## 2013-09-09 ENCOUNTER — Encounter: Payer: Medicaid Other | Admitting: Family

## 2013-09-13 NOTE — H&P (Signed)
Attestation of Attending Supervision of Advanced Practitioner (CNM/NP): Evaluation and management procedures were performed by the Advanced Practitioner under my supervision and collaboration. I have reviewed the Advanced Practitioner's note and chart, and I agree with the management and plan.  Tiffanni Scarfo H. 9:24 AM   

## 2013-09-20 LAB — CBC
MCH: 26.8 pg (ref 26.0–34.0)
Platelets: 111 10*3/uL — ABNORMAL LOW (ref 150–400)
RBC: 3.65 MIL/uL — ABNORMAL LOW (ref 3.87–5.11)
WBC: 14.3 10*3/uL — ABNORMAL HIGH (ref 4.0–10.5)

## 2013-10-07 ENCOUNTER — Encounter: Payer: Medicaid Other | Admitting: Family Medicine

## 2013-10-14 ENCOUNTER — Encounter: Payer: Self-pay | Admitting: *Deleted

## 2013-10-14 ENCOUNTER — Ambulatory Visit (INDEPENDENT_AMBULATORY_CARE_PROVIDER_SITE_OTHER): Payer: Medicaid Other | Admitting: Family Medicine

## 2013-10-14 ENCOUNTER — Encounter: Payer: Self-pay | Admitting: Family Medicine

## 2013-10-14 VITALS — BP 114/79 | HR 76 | Temp 97.7°F | Resp 20 | Ht 62.0 in | Wt 143.6 lb

## 2013-10-14 DIAGNOSIS — Z98891 History of uterine scar from previous surgery: Secondary | ICD-10-CM

## 2013-10-14 NOTE — Progress Notes (Signed)
Patient ID: Jamie [name], female   DOB: 16-Mar-1976, 37 y.o.   MRN: [id number] Subjective:     Jamie [name] is a 37 y.o. female who presents for a postpartum visit. She is 5 weeks postpartum following a LTCS w/ BTL. I have fully reviewed the prenatal and intrapartum course. The delivery was at 32 gestational weeks for PPROM and breach presentation. Outcome: primary cesarean section, low transverse incision. Anesthesia: spinal. Postpartum course has been uncomplicated. Baby's course has been complicated by prematurity, discharged from NICU yesterday. Baby is feeding by bottle - Similac Neosure. Bleeding period started on monday. Bowel function is normal. Bladder function is normal. Patient is not sexually active. Contraception method is tubal ligation. Postpartum depression screening: negative.  The following portions of the patient's history were reviewed and updated as appropriate: allergies, current medications, past family history, past medical history, past social history, past surgical history and problem list.  Review of Systems Pertinent items are noted in HPI.   Objective:    BP 114/79  Pulse 76  Temp(Src) 97.7 F (36.5 C)  Resp 20  Ht 5\' 2"  (1.575 m)  Wt 65.137 kg (143 lb 9.6 oz)  BMI 26.26 kg/m2  LMP 10/12/2013  Breastfeeding? No  General:  alert, cooperative, appears stated age and no distress   Breasts:  inspection negative, no nipple discharge or bleeding, no masses or nodularity palpable  Lungs: clear to auscultation bilaterally and normal percussion bilaterally  Heart:  regular rate and rhythm, S1, S2 normal, no murmur, click, rub or gallop  Abdomen: soft, non-tender; bowel sounds normal; no masses,  no organomegaly, scar is well healed, C/D/I, nttp, ND, no dehisence  No c/c/e Assessment:     Jamie [name] here for postpartum exam. Pap smear not done at today's visit.   Plan:    1. Contraception: tubal ligation 2. Now on 5 year course for Paps - 2019 3.  Follow up in:  annually or as needed.  Tawana Scale, MD OB Fellow

## 2013-10-14 NOTE — Progress Notes (Signed)
Patient ID: Jamie [name], female   DOB: [birth year], 37 y.o.   MRN: [id number] Pt reports numbness of lower abdomen near incision.   Pt states her baby came home from NICU yesterday.

## 2013-11-12 ENCOUNTER — Telehealth: Payer: Self-pay | Admitting: *Deleted

## 2013-11-12 NOTE — Telephone Encounter (Signed)
Patient left a message that she needs a note to return to work. She needs it to be faxed to her HR Attn: Nyoka Lint Fax: [phone].

## 2013-11-17 NOTE — Telephone Encounter (Addendum)
Called pt and stated that I am returning her call regarding a letter to return to work. Please call back with a new message and state the date she returned to work or intends to return.  **Note: Pt had C/S on 09/06/13 and PP visit on 10/14/13. She is medically cleared to return to work as early as 10/15/13.

## 2013-11-18 ENCOUNTER — Encounter: Payer: Self-pay | Admitting: *Deleted

## 2013-11-18 NOTE — Telephone Encounter (Signed)
Patient called back and stated that she returned to work on 11/16/13. She still is waiting on the note. Note typed and faxed per patient request.

## 2014-10-11 ENCOUNTER — Encounter: Payer: Self-pay | Admitting: Family Medicine

## 2015-02-19 IMAGING — US US OB DETAIL+14 WK
2 series · 16 of 28 positions shown · non-contrast
Comparison: none

[Series 1: us ob detail +14 wk · 1 of 6 slices shown (1 of 2)]
[im 1/6]
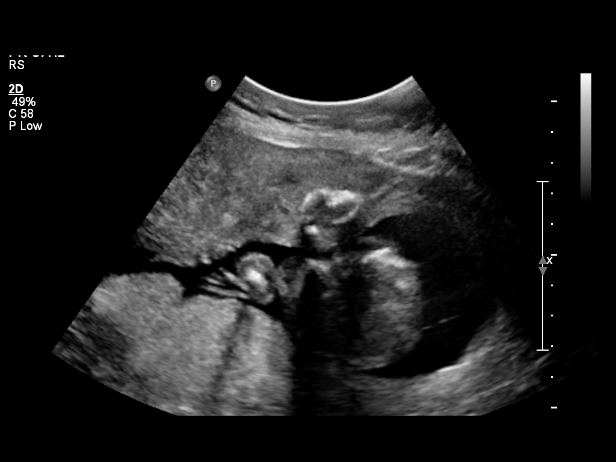

[Series 1: us ob detail +14 wk · 75 acquisitions, 15 frames shown (2 of 2)]
[im 1/75]
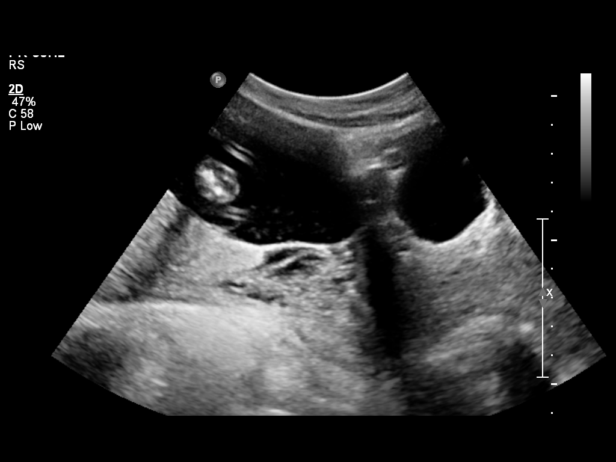
[im 6/75]
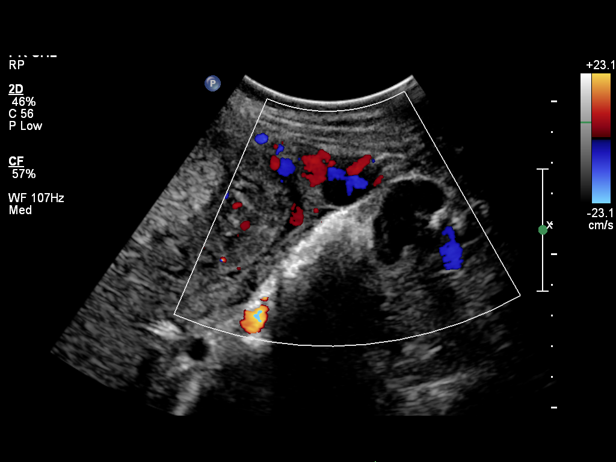
[im 12/75]
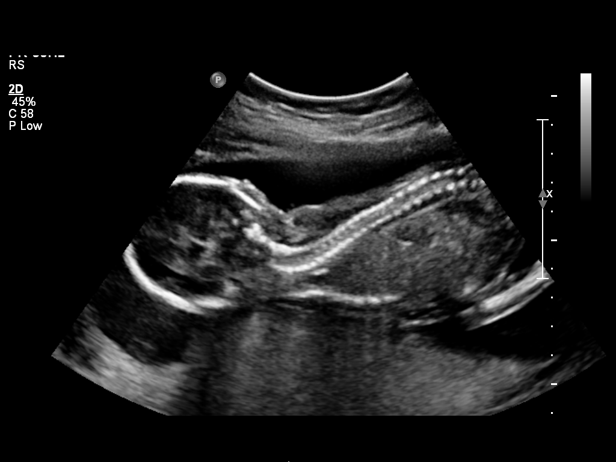
[im 15/75]
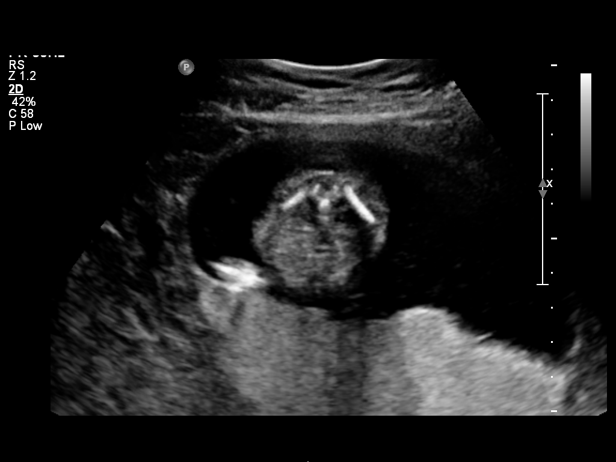
[im 21/75]
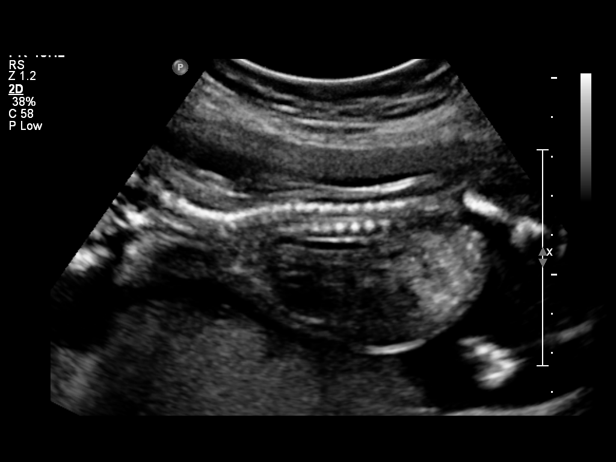
[im 27/75]
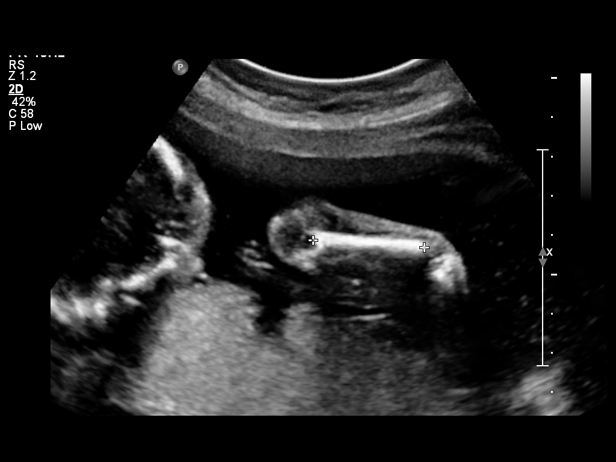
[im 33/75]
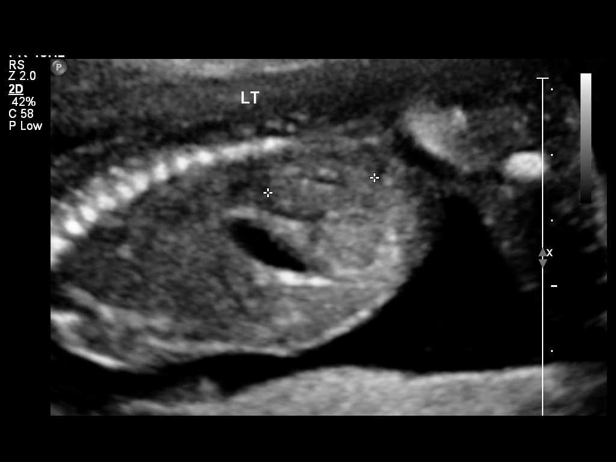
[im 36/75]
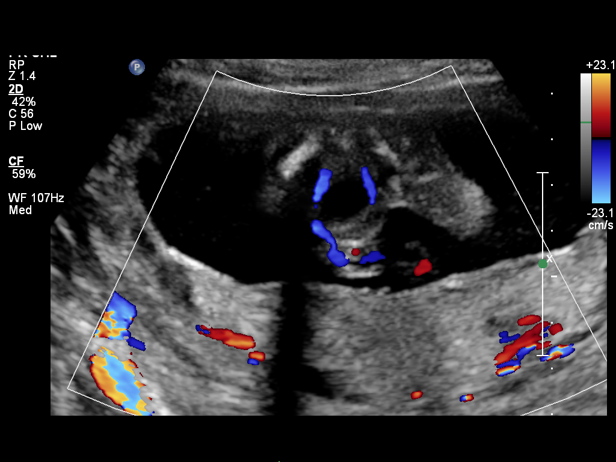
[im 42/75]
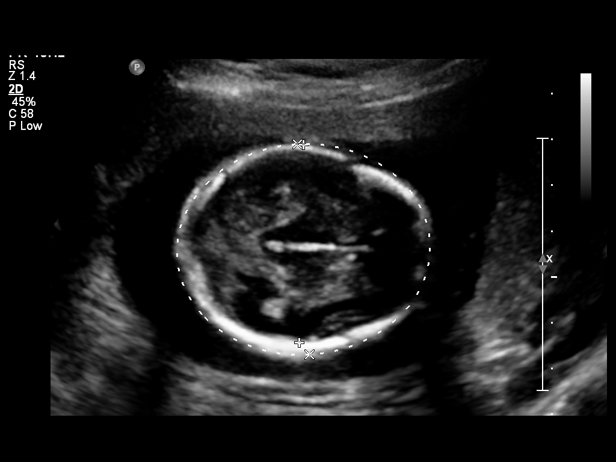
[im 48/75]
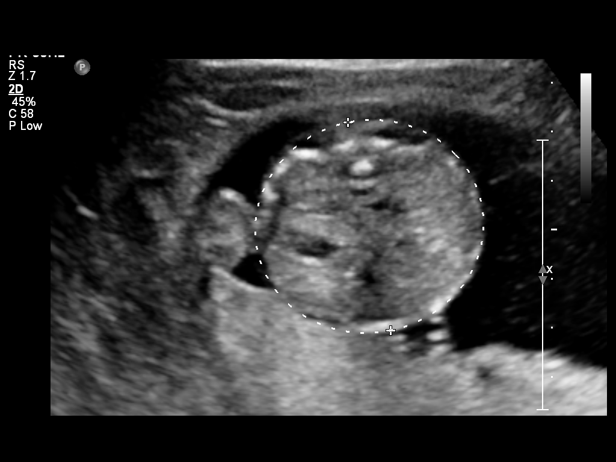
[im 54/75]
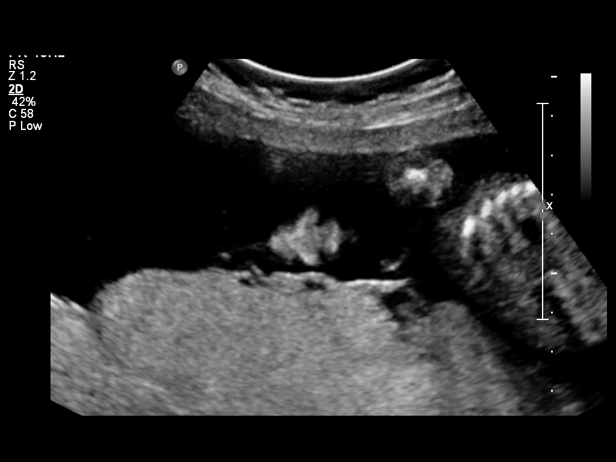
[im 57/75]
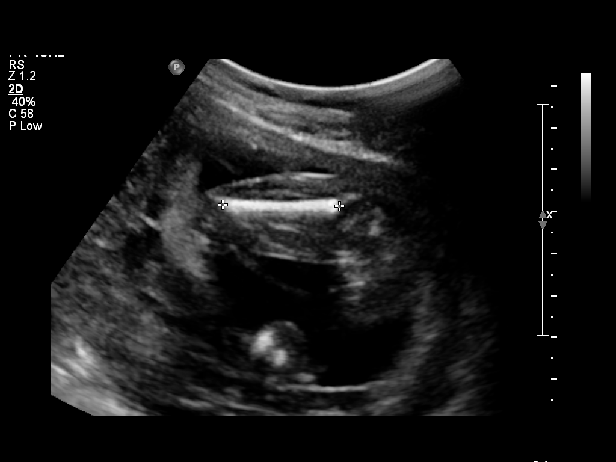
[im 63/75]
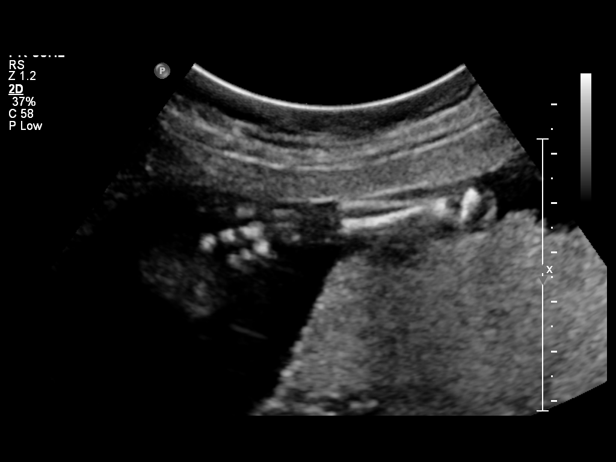
[im 69/75]
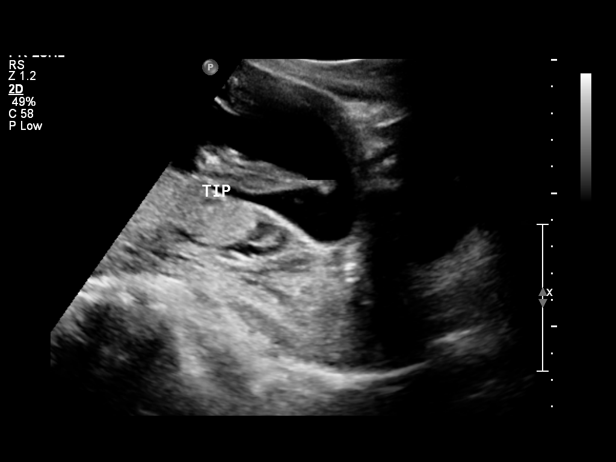
[im 75/75]
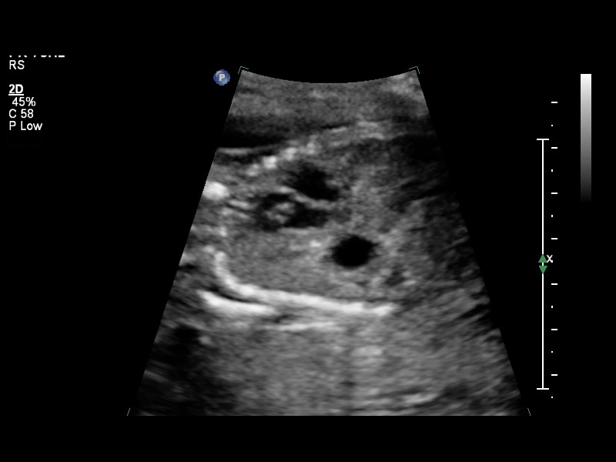

[16 of 28 positions shown; findings below may reference images not displayed]

OBSTETRICS REPORT
                      (Signed Final 06/09/2013 [DATE])

Service(s) Provided

 US OB DETAIL + 14 WK                                  76811.0
Indications

 Detailed fetal anatomic survey
 Advanced maternal age (AMA), Multigravida
Fetal Evaluation

 Num Of Fetuses:    1
 Fetal Heart Rate:  144                         bpm
 Cardiac Activity:  Observed
 Presentation:      Breech
 Placenta:          Posterior, low-lying,
                    cm from int os
 P. Cord            Visualized, central
 Insertion:

 Amniotic Fluid
 AFI FV:      Subjectively within normal limits
                                             Larg Pckt:   5.11   cm
 LLQ:   5.11   cm
Biometry

 BPD:     42.8  mm    G. Age:   19w 0d                CI:        72.56   70 - 86
                                                      FL/HC:      17.4   16.1 -

 HC:     159.8  mm    G. Age:   18w 6d       22  %    HC/AC:      1.13   1.09 -

 AC:     141.4  mm    G. Age:   19w 4d       53  %    FL/BPD:
 FL:      27.8  mm    G. Age:   18w 4d       19  %    FL/AC:      19.7   20 - 24
 HUM:     27.3  mm    G. Age:   18w 5d       36  %
 NFT:     2.98  mm

 Est. FW:     269  gm      0 lb 9 oz     40  %
Gestational Age

 U/S Today:     19w 0d                                        EDD:   11/03/13
 Best:          19w 2d    Det. By:   Early Ultrasound         EDD:   11/01/13
2nd Trimester Genetic Sonogram - Trisomy 21 Screening
 Age:                                             36          Risk=1:   185

 Structural anomalies (inc. cardiac):             N/A
 Echogenic bowel:                                 No
 Hypoplastic / absent midphalanx 5th Digit:       No
 Wide space 6st-6nd toes:                         N/A
 Pyelectasis:                                     No
 2-vessel umbilical cord:                         No
 Echogenic cardiac foci:                          Yes

 Incomplete anatomic evaluation.
Anatomy

 Cranium:          Appears normal         Aortic Arch:      Appears normal
 Fetal Cavum:      Appears normal         Ductal Arch:      Appears normal
 Ventricles:       Appears normal         Diaphragm:        Appears normal
 Choroid Plexus:   Appears normal         Stomach:          Appears normal, left
                                                            sided
 Cerebellum:       Appears normal         Abdomen:          Appears normal
 Posterior Fossa:  Appears normal         Abdominal Wall:   Appears nml (cord
                                                            insert, abd wall)
 Nuchal Fold:      Appears normal         Cord Vessels:     Appears normal (3
                                                            vessel cord)
 Face:             Orbits appear          Kidneys:          Appear normal
                   normal
 Lips:             Appears normal         Bladder:          Appears normal
 Heart:            Echogenic focus        Spine:            Appears normal
                   in LV
 RVOT:             Not well visualized    Lower             Appears normal
                                          Extremities:
 LVOT:             Not well visualized    Upper             Appears normal
                                          Extremities:

 Other:  Male gender. Heels and 5th digit visualized.
Cervix Uterus Adnexa

 Cervical Length:   3.8       cm

 Cervix:       Normal appearance by transabdominal scan.
 Uterus:       Single fibroid noted, see table below.
 Left Ovary:   No adnexal mass visualized.
 Right Ovary:  No adnexal mass visualized.

 Adnexa:     No abnormality visualized.
Myomas

 Site                     L(cm)      W(cm)      D(cm)       Location
 Anterior

 Blood Flow                  RI       PI       Comments
                                               Calcification
Impression

 Siup demonstrating an EGA by ultrasound of 19w 0d. This
 corresponds well with expected EGA by early ultrasound of
 19w 2d.

 Full evaluation of the fetal heart and face were not possible
 today. An EIF is noted in the left cardiac ventricle and this is a
 soft marker for Down Syndrome. Visualized anatomy appears
 normal.  No other soft markers for Down Syndrome are seen
 associated with the visualized anatomy. Correlation with other

 recommended for a more complete risk assessment.

 Current low lying placental position. Follow up is
 recommended at > 28 weeks for full evaluation.

 Small focal fibroid.

 Subjectively and quantitatively normal amniotic fluid volume.
 Normal cervical length.
Recommendations

 Assessment of clinical risk factors and correlation with results
 of any 1st and 2nd trimester aneuploidy screening tests are
 recommended.  If patient is considered low-risk, this isolated
 finding is of unlikely significance.  If patient has other risk
 factors for aneuploidy, genetic counseling and further testing
 should be considered.  This could be scheduled at the
 2682.

## 2019-07-14 ENCOUNTER — Encounter: Payer: Self-pay | Admitting: Obstetrics and Gynecology

## 2019-07-23 ENCOUNTER — Encounter: Payer: Self-pay | Admitting: Obstetrics and Gynecology

## 2019-07-29 ENCOUNTER — Other Ambulatory Visit: Payer: Self-pay

## 2019-07-31 ENCOUNTER — Ambulatory Visit: Payer: Commercial Managed Care - PPO | Admitting: Obstetrics and Gynecology

## 2019-07-31 ENCOUNTER — Other Ambulatory Visit: Payer: Self-pay

## 2019-07-31 ENCOUNTER — Encounter: Payer: Self-pay | Admitting: Obstetrics and Gynecology

## 2019-07-31 VITALS — BP 116/80 | HR 88 | Temp 97.2°F | Resp 14 | Ht 62.0 in | Wt 165.0 lb

## 2019-07-31 DIAGNOSIS — N946 Dysmenorrhea, unspecified: Secondary | ICD-10-CM

## 2019-07-31 DIAGNOSIS — N920 Excessive and frequent menstruation with regular cycle: Secondary | ICD-10-CM

## 2019-07-31 DIAGNOSIS — R109 Unspecified abdominal pain: Secondary | ICD-10-CM | POA: Diagnosis not present

## 2019-07-31 MED ORDER — NORETHIN ACE-ETH ESTRAD-FE 1-20 MG-MCG PO TABS: 1.0000 | ORAL_TABLET | Freq: Every day | ORAL | 0 refills | Status: DC

## 2019-07-31 MED ORDER — IBUPROFEN 800 MG PO TABS: 800.0000 mg | ORAL_TABLET | Freq: Three times a day (TID) | ORAL | 1 refills | Status: DC | PRN

## 2019-07-31 NOTE — Progress Notes (Signed)
43 y.o. [id number] Single Black or African American Not Hispanic or Latino female here as a new patient referred from Dr. Kristie Cowman for dysmenorrhea. Patient complains of having a burning feeling in her side during her periods.    Period Cycle (Days): 28 Period Duration (Days): 6-7 Period Pattern: Regular Menstrual Flow: Heavy Menstrual Control: Maxi pad Menstrual Control Change Freq (Hours): 2 Dysmenorrhea: (!) Mild Dysmenorrhea Symptoms: Other (Comment), Cramping, Diarrhea(fatigue,burning on side, and lower back pain)  She c/o a 2 month h/o a burning sensation on her right side, uncomfortable, up to a 7/10 in discomfort, lasts ~20 minutes. It starts 1-2 days prior to her cycles, is intermittent, continues through out her cycle. Tylenol helps.   With her cycles her bowel are more loose 1 x a day. No urinary complaints.  Full STD testing just done and negative with Dr Ronnald Ramp.  She is sexually active, same partner x 1 year. No dyspareunia, using condoms.   Patient's last menstrual period was 07/24/2019.          Sexually active: Yes.    The current method of family planning is tubal ligation.    Exercising: Yes.    walking Smoker:  no  Health Maintenance: Pap:  2018 with Dr. Kristie Cowman -- normal per patent  History of abnormal Pap:  Yes, no treatment MMG:  07/23/19 -- done at [facility] -- normal per patient TDaP:  07/28/13 Gardasil: never   reports that she has never smoked. She has never used smokeless tobacco. She reports current alcohol use. She reports that she does not use drugs. Occasional ETOH. She works in Press [employer]. Son is almost 78, other son will be 70. Daughter is 51, sophomore at [employer] (wants to go into PT).  Past Medical History:  Diagnosis Date  . Abnormal Pap smear 2004  . Dysmenorrhea   . Vitamin D deficiency     Past Surgical History:  Procedure Laterality Date  . CESAREAN SECTION N/A 09/06/2013   Procedure: Primary cesarean section with delivery of baby boy at 46.   Apgars 9/9.;  Surgeon: Jonnie Kind, MD;  Location: [facility];  Service: Obstetrics;  Laterality: N/A;  . TUBAL LIGATION  09/06/13    Current Outpatient Medications  Medication Sig Dispense Refill  . acetaminophen (TYLENOL) 500 MG tablet Take 500 mg by mouth every 6 (six) hours as needed.    . Ascorbic Acid (VITAMIN C) 100 MG tablet Take 100 mg by mouth daily.    . cholecalciferol (VITAMIN D3) 25 MCG (1000 UT) tablet Take 1,000 Units by mouth daily.     No current facility-administered medications for this visit.     Family History  Problem Relation Age of Onset  . Hyperlipidemia Mother   . Hypertension Mother   . Hypertension Father   . Hyperlipidemia Father   . Hyperlipidemia Maternal Grandmother   . Cancer Other   . Lupus Sister   . Breast cancer Other   Great Aunt with breast cancer.   Review of Systems  Constitutional: Negative.   HENT: Negative.   Eyes: Negative.   Respiratory: Negative.   Cardiovascular: Negative.   Gastrointestinal: Negative.   Endocrine: Negative.   Genitourinary: Negative.   Musculoskeletal: Negative.   Skin: Negative.   Allergic/Immunologic: Negative.   Neurological: Negative.   Hematological: Negative.   Psychiatric/Behavioral: Negative.     Exam:   BP 116/80 (BP Location: Right Arm, Patient Position: Sitting, Cuff Size: Large)   Pulse 88   Temp (!) 97.2  F (36.2 C) (Temporal)   Resp 14   Ht 5\' 2"  (1.575 m)   Wt 165 lb (74.8 kg)   LMP 07/24/2019   BMI 30.18 kg/m   Weight change: @WEIGHTCHANGE @ Height:   Height: 5\' 2"  (157.5 cm)  Ht Readings from Last 3 Encounters:  07/31/19 5\' 2"  (1.575 m)  10/14/13 5\' 2"  (1.575 m)  09/02/13 5\' 2"  (1.575 m)    General appearance: alert, cooperative and appears stated age Head: Normocephalic, without obvious abnormality, atraumatic Neck: no adenopathy, supple, symmetrical, trachea midline and thyroid small subtle lump felt in the right lobe of the thyroid. Normal on the left Lungs: clear to  auscultation bilaterally Cardiovascular: regular rate and rhythm Breasts: normal appearance, no masses or tenderness Abdomen: soft, non-tender; non distended,  no masses,  no organomegaly CVA: not tender Extremities: extremities normal, atraumatic, no cyanosis or edema Skin: Skin color, texture, turgor normal. No rashes or lesions Lymph nodes: Cervical, supraclavicular, and axillary nodes normal. No abnormal inguinal nodes palpated Neurologic: Grossly normal   Pelvic: External genitalia:  no lesions              Urethra:  normal appearing urethra with no masses, tenderness or lesions              Bartholins and Skenes: normal                 Vagina: normal appearing vagina with normal color and discharge, no lesions              Cervix: no cervical motion tenderness and no lesions               Bimanual Exam:  Uterus:  normal size, contour, position, consistency, mobility, non-tender              Adnexa: no mass, fullness, tenderness               Rectovaginal: declined  Chaperone was present for exam.  A:  Burning pain in her right mid side with her cycle. The pain is in an unusual location, but clearly linked to her cycles. No bowel, bladder or MS complaints  Heavy cycles, denies anemia  Dysmenorrhea  Subtle lump on the right lobe of her thyroid, will have her f/u with Dr Ronnald Ramp   P:   Patient reports normal lab work and negative STD testing with Dr Ronnald Ramp last month  Discussed the option of returning for an ultrasound, or trying low dose OCP's  She would like to try low dose OCP's, no contraindications, risks reviewed.  The pill should also help her baseline heavy cycles and dysmenorrhea.   F/u in 3 months, if her pain doesn't improve, will set up an ultrasound.   Ibuprofen for pain

## 2019-07-31 NOTE — Patient Instructions (Signed)
Have Dr Ronnald Ramp repeat your thyroid exam  Oral Contraception Information Oral contraceptive pills (OCPs) are medicines taken to prevent pregnancy. OCPs are taken by mouth, and they work by:  Preventing the ovaries from releasing eggs.  Thickening mucus in the lower part of the uterus (cervix), which prevents sperm from entering the uterus.  Thinning the lining of the uterus (endometrium), which prevents a fertilized egg from attaching to the endometrium. OCPs are highly effective when taken exactly as prescribed. However, OCPs do not prevent STIs (sexually transmitted infections). Safe sex practices, such as using condoms while on an OCP, can help prevent STIs. Before starting OCPs Before you start taking OCPs, you may have a physical exam, blood test, and Pap test. However, you are not required to have a pelvic exam in order to be prescribed OCPs. Your health care provider will make sure you are a good candidate for oral contraception. OCPs are not a good option for certain women, including women who smoke and are older than 35 years, and women with a medical history of high blood pressure, deep vein thrombosis, pulmonary embolism, stroke, cardiovascular disease, or peripheral vascular disease. Discuss with your health care provider the possible side effects of the OCP you may be prescribed. When you start an OCP, be aware that it can take 2-3 months for your body to adjust to changes in hormone levels. Follow instructions from your health care provider about how to start taking your first cycle of OCPs. Depending on when you start the pill, you may need to use a backup form of birth control, such as condoms, during the first week. Make sure you know what steps to take if you ever forget to take the pill. Types of oral contraception  The most common types of birth control pills contain the hormones estrogen and progestin (synthetic progesterone) or progestin only. The combination pill This type of  pill contains estrogen and progestin hormones. Combination pills often come in packs of 21, 28, or 91 pills. For each pack, the last 7 pills may not contain hormones, which means you may stop taking the pills for 7 days. Menstrual bleeding occurs during the week that you do not take the pills or that you take the pills with no hormones in them. The minipill This type of pill contains the progestin hormone only. It comes in packs of 28 pills. All 28 pills contain the hormone. You take the pill every day. It is very important to take the pill at the same time each day. Advantages of oral contraceptive pills  Provides reliable and continuous contraception if taken as instructed.  May treat or decrease symptoms of: ? Menstrual period cramps. ? Irregular menstrual cycle or bleeding. ? Heavy menstrual flow. ? Abnormal uterine bleeding. ? Acne, depending on the type of pill. ? Polycystic ovarian syndrome. ? Endometriosis. ? Iron deficiency anemia. ? Premenstrual symptoms, including premenstrual dysphoric disorder.  May reduce the risk of endometrial and ovarian cancer.  Can be used as emergency contraception.  Prevents mislocated (ectopic) pregnancies and infections of the fallopian tubes. Things that can make oral contraceptive pills less effective OCPs can be less effective if:  You forget to take the pill at the same time every day. This is especially important when taking the minipill.  You have a stomach or intestinal disease that reduces your body's ability to absorb the pill.  You take OCPs with other medicines that make OCPs less effective, such as antibiotics, certain HIV medicines, and some  seizure medicines.  You take expired OCPs.  You forget to restart the pill on day 7, if using the packs of 21 pills. Risks associated with oral contraceptive pills Oral contraceptive pills can sometimes cause side effects, such as:  Headache.  Depression.  Trouble sleeping.  Nausea  and vomiting.  Breast tenderness.  Irregular bleeding or spotting during the first several months.  Bloating or fluid retention.  Increase in blood pressure. Combination pills are also associated with a small increase in the risk of:  Blood clots.  Heart attack.  Stroke. Summary  Oral contraceptive pills are medicines taken by mouth to prevent pregnancy. They are highly effective when taken exactly as prescribed.  The most common types of birth control pills contain the hormones estrogen and progestin (synthetic progesterone) or progestin only.  Before you start taking the pill, you may have a physical exam, blood test, and Pap test. Your health care provider will make sure you are a good candidate for oral contraception.  The combination pill may come in a 21-day pack, a 28-day pack, or a 91-day pack. The minipill contains the progesterone hormone only and comes in packs of 28 pills.  Oral contraceptive pills can sometimes cause side effects, such as headache, nausea, breast tenderness, or irregular bleeding. This information is not intended to replace advice given to you by your health care provider. Make sure you discuss any questions you have with your health care provider. Document Released: 02/16/2003 Document Revised: 11/08/2017 Document Reviewed: 02/19/2017 Elsevier Patient Education  2020 [employer].

## 2019-11-09 ENCOUNTER — Encounter: Payer: Self-pay | Admitting: Obstetrics and Gynecology

## 2019-11-09 ENCOUNTER — Ambulatory Visit: Payer: Commercial Managed Care - PPO | Admitting: Obstetrics and Gynecology

## 2019-11-09 NOTE — Progress Notes (Deleted)
GYNECOLOGY  VISIT   HPI: 43 y.o.   Single Black or African American Not Hispanic or Latino  female   [phone] with No LMP recorded.   here for     GYNECOLOGIC HISTORY: No LMP recorded. Contraception:*** Menopausal hormone therapy: ***        OB History    Gravida  4   Para  3   Term  2   Preterm  1   AB  1   Living  3     SAB  1   TAB  0   Ectopic      Multiple      Live Births  3              Patient Active Problem List   Diagnosis Date Noted  . Preterm premature rupture of membranes in third trimester 31 wk 09/Jamie/2014    Past Medical History:  Diagnosis Date  . Abnormal Pap smear 2004  . Dysmenorrhea   . Vitamin D deficiency     Past Surgical History:  Procedure Laterality Date  . CESAREAN SECTION N/A 09/06/2013   Procedure: Primary cesarean section with delivery of baby boy at 40.  Apgars 9/9.;  Surgeon: Jonnie Kind, MD;  Location: [facility];  Service: Obstetrics;  Laterality: N/A;  . TUBAL LIGATION  09/06/13    Current Outpatient Medications  Medication Sig Dispense Refill  . acetaminophen (TYLENOL) 500 MG tablet Take 500 mg by mouth every 6 (six) hours as needed.    . Ascorbic Acid (VITAMIN C) 100 MG tablet Take 100 mg by mouth daily.    . cholecalciferol (VITAMIN D3) Jamie MCG (1000 UT) tablet Take 1,000 Units by mouth daily.    Marland Kitchen ibuprofen (ADVIL) 800 MG tablet Take 1 tablet (800 mg total) by mouth every 8 (eight) hours as needed. 30 tablet 1  . norethindrone-ethinyl estradiol (LOESTRIN FE) 1-20 MG-MCG tablet Take 1 tablet by mouth daily. 3 Package 0   No current facility-administered medications for this visit.      ALLERGIES: Patient has no known allergies.  Family History  Problem Relation Age of Onset  . Hyperlipidemia Mother   . Hypertension Mother   . Hypertension Father   . Hyperlipidemia Father   . Hyperlipidemia Maternal Grandmother   . Cancer Other   . Lupus Sister   . Breast cancer Other     Social History    Socioeconomic History  . Marital status: Single    Spouse name: Not on file  . Number of children: Not on file  . Years of education: Not on file  . Highest education level: Not on file  Occupational History  . Not on file  Social Needs  . Financial resource strain: Not on file  . Food insecurity    Worry: Not on file    Inability: Not on file  . Transportation needs    Medical: Not on file    Non-medical: Not on file  Tobacco Use  . Smoking status: Never Smoker  . Smokeless tobacco: Never Used  Substance and Sexual Activity  . Alcohol use: Yes    Comment: occasional  . Drug use: No  . Sexual activity: Yes    Birth control/protection: Surgical    Comment: tubal ligation  Lifestyle  . Physical activity    Days per week: Not on file    Minutes per session: Not on file  . Stress: Not on file  Relationships  . Social connections  Talks on phone: Not on file    Gets together: Not on file    Attends religious service: Not on file    Active member of club or organization: Not on file    Attends meetings of clubs or organizations: Not on file    Relationship status: Not on file  . Intimate partner violence    Fear of current or ex partner: Not on file    Emotionally abused: Not on file    Physically abused: Not on file    Forced sexual activity: Not on file  Other Topics Concern  . Not on file  Social History Narrative  . Not on file    ROS  PHYSICAL EXAMINATION:    There were no vitals taken for this visit.    General appearance: alert, cooperative and appears stated age Neck: no adenopathy, supple, symmetrical, trachea midline and thyroid {CHL AMB PHY EX THYROID NORM [phone]::"normal to inspection and palpation"} Breasts: {Exam; breast:[phone]::"normal appearance, no masses or tenderness"} Abdomen: soft, non-tender; non distended, no masses,  no organomegaly  Pelvic: External genitalia:  no lesions              Urethra:  normal appearing urethra  with no masses, tenderness or lesions              Bartholins and Skenes: normal                 Vagina: normal appearing vagina with normal color and discharge, no lesions              Cervix: {CHL AMB PHY EX CERVIX NORM [phone]::"no lesions"}              Bimanual Exam:  Uterus:  {CHL AMB PHY EX UTERUS NORM DEFAULT:[phone]::"normal size, contour, position, consistency, mobility, non-tender"}              Adnexa: {CHL AMB PHY EX ADNEXA NO MASS DEFAULT:[phone]::"no mass, fullness, tenderness"}              Rectovaginal: {yes no:[id number]}.  Confirms.              Anus:  normal sphincter tone, no lesions  Chaperone was present for exam.  ASSESSMENT     PLAN    An After Visit Summary was printed and given to the patient.  *** minutes face to face time of which over 50% was spent in counseling.

## 2020-12-10 DIAGNOSIS — D509 Iron deficiency anemia, unspecified: Secondary | ICD-10-CM

## 2020-12-10 DIAGNOSIS — N83201 Unspecified ovarian cyst, right side: Secondary | ICD-10-CM

## 2020-12-10 HISTORY — DX: Iron deficiency anemia, unspecified: D50.9

## 2020-12-10 HISTORY — DX: Unspecified ovarian cyst, right side: N83.201

## 2021-06-16 ENCOUNTER — Other Ambulatory Visit: Payer: Self-pay

## 2021-06-16 ENCOUNTER — Other Ambulatory Visit (HOSPITAL_COMMUNITY)
Admission: RE | Admit: 2021-06-16 | Discharge: 2021-06-16 | Disposition: A | Discharge status: Home / Self Care | Payer: Commercial Managed Care - PPO | Source: Ambulatory Visit | Attending: Obstetrics and Gynecology | Admitting: Obstetrics and Gynecology

## 2021-06-16 ENCOUNTER — Encounter: Payer: Self-pay | Admitting: Obstetrics and Gynecology

## 2021-06-16 ENCOUNTER — Ambulatory Visit (INDEPENDENT_AMBULATORY_CARE_PROVIDER_SITE_OTHER): Payer: Commercial Managed Care - PPO | Admitting: Obstetrics and Gynecology

## 2021-06-16 VITALS — BP 120/80 | Ht 63.0 in | Wt 173.0 lb

## 2021-06-16 DIAGNOSIS — Z862 Personal history of diseases of the blood and blood-forming organs and certain disorders involving the immune mechanism: Secondary | ICD-10-CM

## 2021-06-16 DIAGNOSIS — Z124 Encounter for screening for malignant neoplasm of cervix: Secondary | ICD-10-CM

## 2021-06-16 DIAGNOSIS — N939 Abnormal uterine and vaginal bleeding, unspecified: Secondary | ICD-10-CM

## 2021-06-16 DIAGNOSIS — Z86018 Personal history of other benign neoplasm: Secondary | ICD-10-CM

## 2021-06-16 DIAGNOSIS — E079 Disorder of thyroid, unspecified: Secondary | ICD-10-CM

## 2021-06-16 DIAGNOSIS — R6882 Decreased libido: Secondary | ICD-10-CM

## 2021-06-16 DIAGNOSIS — R232 Flushing: Secondary | ICD-10-CM | POA: Diagnosis not present

## 2021-06-16 MED ORDER — NORETHIN ACE-ETH ESTRAD-FE 1-20 MG-MCG PO TABS: 1.0000 | ORAL_TABLET | Freq: Every day | ORAL | 0 refills | Status: DC

## 2021-06-16 NOTE — Progress Notes (Addendum)
GYNECOLOGY  VISIT   HPI: 45 y.o.   Single Black or African American Not Hispanic or Latino  female   [phone] with Patient's last menstrual period was 05/21/2021.   here for  check fibroids-heavy menses  The patient was originally seen in 8/20 c/o heavy cycles (without anemia at that time) and dysmenorrhea. She was given a script for OCP's and advised to return in 3 months. In 1/22 she was seen by Lucky Cowboy for AUB and had a Hgb of 8.8. She has been taking iron daily. She is feeling better on the iron pills.   Cycles have gotten irregular in the last year. Menses are every 1-2 months, can bleed for 8 days or a whole month. She can saturate a pad within an hour. No BTB. Cramps can vary from mild to severe, ibuprofen takes the edge off.   She does have fatigue, some constipation (helped with Magnesium). No hair loss. Mild dry skin. No galactorrhea.  She is having hot flashes and night sweats, so far tolerable.   She had an ultrasound a few months ago, was told she had fibroids  Sexually active, same partner x 1.5 years. Has had negative STD testing since together. No dyspareunia. Decrease in libido. Can enjoy sex and have an orgasm.  Last pap with Dr Ronnald Ramp in 2018.   SH: non smoker, occasional ETOH, works in [employer]. 3 kids: 21, 9 and 7.  Last mammogram was in 2021  GYNECOLOGIC HISTORY: Patient's last menstrual period was 05/21/2021. Contraception:Tubal lig Menopausal hormone therapy: none        OB History     Gravida  4   Para  3   Term  2   Preterm  1   AB  1   Living  3      SAB  1   IAB  0   Ectopic      Multiple      Live Births  3              Patient Active Problem List   Diagnosis Date Noted   Preterm premature rupture of membranes in third trimester 31 wk 09/03/2013    Past Medical History:  Diagnosis Date   Abnormal Pap smear 2004   Dysmenorrhea    Vitamin D deficiency     Past Surgical History:  Procedure Laterality Date    CESAREAN SECTION N/A 09/06/2013   Procedure: Primary cesarean section with delivery of baby boy at 1136.  Apgars 9/9.;  Surgeon: Jonnie Kind, MD;  Location: [facility];  Service: Obstetrics;  Laterality: N/A;   TUBAL LIGATION  09/06/13  1 c/s  Current Outpatient Medications  Medication Sig Dispense Refill   acetaminophen (TYLENOL) 500 MG tablet Take 500 mg by mouth every 6 (six) hours as needed.     Ascorbic Acid (VITAMIN C) 100 MG tablet Take 100 mg by mouth daily.     cholecalciferol (VITAMIN D3) 25 MCG (1000 UT) tablet Take 1,000 Units by mouth daily.     ibuprofen (ADVIL) 800 MG tablet Take 1 tablet (800 mg total) by mouth every 8 (eight) hours as needed. 30 tablet 1   Vitamin D, Ergocalciferol, (DRISDOL) 1.25 MG (50000 UNIT) CAPS capsule Take 50,000 Units by mouth once a week.     No current facility-administered medications for this visit.     ALLERGIES: Patient has no known allergies.  Family History  Problem Relation Age of Onset   Hyperlipidemia Mother  Hypertension Mother    Hypertension Father    Hyperlipidemia Father    Hyperlipidemia Maternal Grandmother    Cancer Other    Lupus Sister    Breast cancer Other     Social History   Socioeconomic History   Marital status: Single    Spouse name: Not on file   Number of children: Not on file   Years of education: Not on file   Highest education level: Not on file  Occupational History   Not on file  Tobacco Use   Smoking status: Never   Smokeless tobacco: Never  Vaping Use   Vaping Use: Never used  Substance and Sexual Activity   Alcohol use: Yes    Comment: occasional   Drug use: No   Sexual activity: Yes    Birth control/protection: Surgical    Comment: tubal ligation  Other Topics Concern   Not on file  Social History Narrative   Not on file   Social Determinants of Health   Financial Resource Strain: Not on file  Food Insecurity: Not on file  Transportation Needs: Not on file  Physical  Activity: Not on file  Stress: Not on file  Social Connections: Not on file  Intimate Partner Violence: Not on file    ROS  PHYSICAL EXAMINATION:    BP 120/80 (BP Location: Right Arm, Patient Position: Sitting, Cuff Size: Normal)   Ht 5\' 3"  (1.6 m)   Wt 173 lb (78.5 kg)   LMP 05/21/2021   BMI 30.65 kg/m     General appearance: alert, cooperative and appears stated age Neck: no adenopathy, supple, symmetrical, trachea midline and thyroid  ~1 cm lump in the right lobe of her thyroid Heart: regular rate and rhythm Lungs: CTAB Abdomen: soft, non-tender; bowel sounds normal; no masses,  no organomegaly Extremities: normal, atraumatic, no cyanosis Skin: normal color, texture and turgor, no rashes or lesions Lymph: normal cervical supraclavicular and inguinal nodes Neurologic: grossly normal   Pelvic: External genitalia:  no lesions              Urethra:  normal appearing urethra with no masses, tenderness or lesions              Bartholins and Skenes: normal                 Vagina: normal appearing vagina with normal color and discharge, no lesions              Cervix: no cervical motion tenderness and no lesions              Bimanual Exam:  Uterus:   anteverted, mobile, top normal sized, slightly irregular, not tender              Adnexa: no mass, fullness, tenderness              Rectovaginal: declined  Chaperone was present for exam.  1. Abnormal uterine bleeding (AUB) Heavy, some skipped cycles. Discussed anovulation. Was told she had fibroids on a recent ultrasound - norethindrone-ethinyl estradiol-FE (LOESTRIN FE) 1-20 MG-MCG tablet; Take 1 tablet by mouth daily.  Dispense: 84 tablet; Refill: 0 - CBC - TSH - Prolactin  2. History of anemia On oral iron Discussed risks of OCP's, no contraindications. She would like to try them - norethindrone-ethinyl estradiol-FE (LOESTRIN FE) 1-20 MG-MCG tablet; Take 1 tablet by mouth daily.  Dispense: 84 tablet; Refill: 0 - CBC -  Ferritin -F/U in 3 months  3. Hot  flashes Worse with her cycles - Follicle stimulating hormone  4. Screening for cervical cancer - Cytology - PAP  5. History of uterine fibroid Will get ultrasound report  6. Lump in thyroid -this was also noted in 2020, she never had f/u with her primary - US SOFT TISSUE HEAD & NECK (NON-THYROID); Future  7. Low libido Discussed, information given   CC: Lucky Cowboy, NP  Addendum: 12/27/20 Ultrasound from [facility] reviewed Uterus 11.4 x 6.9 x 6.5 cm, uterus has a slightly lobular heterogeneous appearance suggesting possible intramural fibroids.  Endometrial stripe 1.4 cm Multiple cystic lesions are noted in the right adnexal region with a maximum dimension of 2.8, 3.1, and 5.0 cm Right ovary: 4 x 3.7 cm Left ovary 4.5 x 3.3 x 2.5 cm, the entire left ovary has a cystic appearance. Impression: cystic appearance of the left ovary with multiple cystic lesions in the right adnexal region, question endometriosis Uterine enlargement with possible fibroids Thickened endometrial stripe.   Will recommend that she return for another ultrasound, after her cycle.

## 2021-06-16 NOTE — Patient Instructions (Signed)
Oral Contraception Information Oral contraceptive pills (OCPs) are medicines taken by mouth to prevent pregnancy. They work by: Preventing the ovaries from releasing eggs. Thickening mucus in the lower part of the uterus (cervix). This prevents sperm from entering the uterus. Thinning the lining of the uterus (endometrium). This prevents a fertilized egg from attaching to the endometrium. OCPs are highly effective when taken exactly as prescribed. However, OCPs do not prevent STIs (sexually transmitted infections). Using condoms while on an OCP can help prevent STIs. What happens before starting OCPs? Before you start taking OCPs: You may have a physical exam, blood test, and Pap test. Your health care provider will make sure you are a good candidate for oral contraception. OCPs are not a good option for certain women, such as: Women who smoke and are older than age 35. Women who have or have had certain conditions, such as: A history of high blood pressure. Deep vein thrombosis. Pulmonary embolism. Stroke. Cardiovascular disease. Peripheral vascular disease. Ask your health care provider about the possible side effects of the OCP you may be prescribed. Be aware that it can take 2-3 months for your body to adjustto changes in hormone levels. Types of oral contraception  Birth control pills contain the hormones estrogen and progestin (synthetic progesterone) or progestin only. The combination pill This type of pill contains estrogen and progestin hormones. Conventional contraception pills come in packs of 21 or 28 pills. Some packs with 28-day pills contain estrogen and progestin for the first 21-24 days. Hormone-free tablets, called placebos, are taken for the final 4-7 days. You should have menstrual bleeding during the time you take the placebos. In packs with 21 tablets, you take no pills for 7 days. Menstrual bleeding occurs during these days. (Some people prefer taking a pill for 28  days to help establish a routine). Extended-interval contraception pills come in packs of 91 pills. The first 84 tablets have both estrogen and progestin. The last 7 pills are placebos. Menstrual bleeding occurs during the placebo days. With this schedule, menstrual bleeding happens once every 3 months. Continuous contraception pills come in packs of 28 pills. All pills in the pack contain estrogen and progestin. With this schedule, regular menstrual bleeding does not happen, but there may be spotting or irregular bleeding. Progestin-only pills This type of pill is often called the mini-pill and contains the progestin hormone only. It comes in packs of 28 pills. In some packs, the last 4 pills are placebos. The pill must be taken at the same time every day. This is very important to prevent pregnancy. Menstrual bleeding may not be regular orpredictable. What are the advantages? Oral contraception provides reliable and continuous contraception if taken as directed. It may treat or decrease symptoms of: Menstrual period cramps. Irregular menstrual cycle or bleeding. Heavy menstrual flow. Abnormal uterine bleeding. Acne, depending on the type of pill. Polycystic ovarian syndrome (POS). Endometriosis. Iron deficiency anemia. Premenstrual symptoms, including severe irritability, depression, or anxiety. It also may: Reduce the risk of endometrial and ovarian cancer. Be used as emergency contraception. Prevent ectopic pregnancies and infections of the fallopian tubes. What can make OCPs less effective? OCPs may be less effective if: You forget to take the pill every day. For progestin-only pills, it is especially important to take the pill at the same time each day. Even taking it 3 hours late can increase the risk of pregnancy. You have a stomach or intestinal disease that reduces your body's ability to absorb the pill. You take   OCPs with other medicines that make OCPs less effective, such as  antibiotics, certain HIV medicines, and some seizure medicines. You take expired OCPs. You forget to restart the pill after 7 days of not taking it. This refers to the packs of 21 pills. What are the side effects and risks? OCPs can sometimes cause side effects, such as: Headache. Depression. Trouble sleeping. Nausea and vomiting. Breast tenderness. Irregular bleeding or spotting during the first several months. Bloating or fluid retention. Increase in blood pressure. Combination pills may slightly increase the risk of: Blood clots. Heart attack. Stroke. Follow these instructions at home: Follow instructions from your health care provider about how to start taking your first cycle of OCPs. Depending on when you start the pill, you may need to use a backup form of birth control, such as condoms, during the first week.Make sure you know what steps to take if you forget to take the pill. Summary Oral contraceptive pills (OCPs) are medicines taken by mouth to prevent pregnancy. They are highly effective when taken exactly as prescribed. OCPs contain a combination of the hormones estrogen and progestin (synthetic progesterone) or progestin only. Before you start taking the pill, you may have a physical exam, blood test, and Pap test. Your health care provider will make sure you are a good candidate for oral contraception. The combination pill may come in a 21-day pack, a 28-day pack, or a 91-day pack. Progestin-only pills come in packs of 28 pills. OCPs can sometimes cause side effects, such as headache, nausea, breast tenderness, or irregular bleeding. This information is not intended to replace advice given to you by your health care provider. Make sure you discuss any questions you have with your healthcare provider. Document Revised: 08/26/2020 Document Reviewed: 08/04/2020 Elsevier Patient Education  2022 [employer] Inc.  

## 2021-06-17 LAB — FOLLICLE STIMULATING HORMONE: FSH: 9 m[IU]/mL

## 2021-06-17 LAB — CBC
HCT: 32.4 % — ABNORMAL LOW (ref 35.0–45.0)
Hemoglobin: 9.6 g/dL — ABNORMAL LOW (ref 11.7–15.5)
MCH: 23.1 pg — ABNORMAL LOW (ref 27.0–33.0)
MCHC: 29.6 g/dL — ABNORMAL LOW (ref 32.0–36.0)
MCV: 78.1 fL — ABNORMAL LOW (ref 80.0–100.0)
MPV: 13.7 fL — ABNORMAL HIGH (ref 7.5–12.5)
Platelets: 267 10*3/uL (ref 140–400)
RBC: 4.15 10*6/uL (ref 3.80–5.10)
RDW: 15.4 % — ABNORMAL HIGH (ref 11.0–15.0)
WBC: 5.9 10*3/uL (ref 3.8–10.8)

## 2021-06-17 LAB — FERRITIN: Ferritin: 8 ng/mL — ABNORMAL LOW (ref 16–232)

## 2021-06-17 LAB — PROLACTIN: Prolactin: 9.1 ng/mL

## 2021-06-17 LAB — TSH: TSH: 0.74 mIU/L

## 2021-06-19 ENCOUNTER — Telehealth: Payer: Self-pay

## 2021-06-19 DIAGNOSIS — Z862 Personal history of diseases of the blood and blood-forming organs and certain disorders involving the immune mechanism: Secondary | ICD-10-CM

## 2021-06-19 NOTE — Telephone Encounter (Signed)
Patient  asked about u/s of her neck. Has not heard from anyone.    6. Lump in thyroid -this was also noted in 2020, she never had f/u with her primary - US SOFT TISSUE HEAD & NECK (NON-THYROID); Future   Also, per Dr. Talbert Nan  "she continues to have anemia with low iron stores and set her up for aconsultation with hematology for possible iron transfusions."

## 2021-06-19 NOTE — Telephone Encounter (Signed)
1.Dr.Jertson placed the order ultrasound number given to schedule with [facility].   2. Order placed at [facility] hematology they will call to schedule.  Patient aware with all the above.

## 2021-06-20 LAB — CYTOLOGY - PAP
Comment: NEGATIVE
Diagnosis: NEGATIVE
High risk HPV: NEGATIVE

## 2021-06-22 NOTE — Telephone Encounter (Signed)
Thyroid ultrasound scheduled on 06/28/21.  Hematology has left message for patient to call x 1.

## 2021-06-28 ENCOUNTER — Ambulatory Visit
Admission: RE | Admit: 2021-06-28 | Discharge: 2021-06-28 | Disposition: A | Discharge status: Home / Self Care | Payer: Commercial Managed Care - PPO | Source: Ambulatory Visit | Attending: Obstetrics and Gynecology | Admitting: Obstetrics and Gynecology

## 2021-06-28 ENCOUNTER — Other Ambulatory Visit: Payer: Self-pay

## 2021-06-28 DIAGNOSIS — E079 Disorder of thyroid, unspecified: Secondary | ICD-10-CM

## 2021-06-29 ENCOUNTER — Telehealth: Payer: Self-pay | Admitting: Hematology and Oncology

## 2021-06-29 NOTE — Telephone Encounter (Signed)
Patient scheduled with Hematology on 07/06/21 with Benay Pike, MD

## 2021-06-29 NOTE — Telephone Encounter (Signed)
Scheduled appt per referral. Pt aware.  

## 2021-07-02 ENCOUNTER — Telehealth: Payer: Self-pay | Admitting: Obstetrics and Gynecology

## 2021-07-02 DIAGNOSIS — Z86018 Personal history of other benign neoplasm: Secondary | ICD-10-CM

## 2021-07-02 NOTE — Telephone Encounter (Signed)
Please let the patient know that I received her ultrasound report from 1/22. There was a suggestion of fibroids, a thickened endometrial stripe and bilateral ovarian cysts, with a 5 cm cyst on the right and some concern for endometriosis. Please set her up for a f/u ultrasound after her cycle. She is just starting on OCP's, so I would plan it after her first pack. She doesn't need an appointment with the ultrasound, but should have a 3 month f/u appointment after starting on OCP's.

## 2021-07-03 NOTE — Telephone Encounter (Signed)
Left message for patient to call.

## 2021-07-06 ENCOUNTER — Inpatient Hospital Stay: Payer: Commercial Managed Care - PPO | Attending: Hematology and Oncology | Admitting: Hematology and Oncology

## 2021-07-06 ENCOUNTER — Other Ambulatory Visit: Payer: Self-pay

## 2021-07-06 ENCOUNTER — Inpatient Hospital Stay: Payer: Commercial Managed Care - PPO

## 2021-07-06 ENCOUNTER — Encounter: Payer: Self-pay | Admitting: Hematology and Oncology

## 2021-07-06 VITALS — BP 137/97 | HR 98 | Temp 98.8°F | Resp 18 | Ht 63.0 in | Wt 177.7 lb

## 2021-07-06 DIAGNOSIS — N920 Excessive and frequent menstruation with regular cycle: Secondary | ICD-10-CM

## 2021-07-06 DIAGNOSIS — D500 Iron deficiency anemia secondary to blood loss (chronic): Secondary | ICD-10-CM

## 2021-07-06 DIAGNOSIS — Z803 Family history of malignant neoplasm of breast: Secondary | ICD-10-CM | POA: Diagnosis not present

## 2021-07-06 NOTE — Progress Notes (Signed)
[place] NOTE  Patient Care Team: Kristie Cowman, MD as PCP - General (Family Medicine)  CHIEF COMPLAINTS/PURPOSE OF CONSULTATION:  [name]  ASSESSMENT & PLAN:   Orders Placed This Encounter  Procedures   CBC with Differential/Platelet    Standing Status:   Future    Standing Expiration Date:   07/06/2022   Iron and TIBC    Standing Status:   Future    Standing Expiration Date:   07/06/2022   Ferritin    Standing Status:   Future    Standing Expiration Date:   07/06/2022   This is a very pleasant 45 year old female patient with iron deficiency anemia related to chronic menstrual blood loss referred to hematology for additional recommendations.  We have discussed the following details about iron deficiency anemia. Iron deficiency anemia affects a large proportion of the wellness population especially females of childbearing age and children.  Common causes of iron deficiency include blood loss, reduced iron absorption because of previous surgeries, dietary restrictions or other malabsorption issues, medications that reduce gastric acidity are due to inherited disorders such as IRIDA due to TMPRSS6 mutation. Symptoms of iron deficiency usually include fatigue, pica, restless legs, exercise intolerance, exertional dyspnea, headaches and weakness. Diagnosis can be usually made by history, physical examination, CBC and iron studies.  We generally treat iron deficiency anemia with oral supplements if this can be tolerated.  IV iron is appropriate for patients are unable to tolerate due to gastrointestinal side effects or for patients with severe/ongoing blood loss, history of gastric bypass which reduces gastric acid and henceforth will impair intestinal absorption of oral iron, malabsorption syndromes are occasional in pregnancy. She would like to try the oral iron supplementation first, discussed about trying ferrous sulfate twice a day along with some stool softeners if she  has any issues with constipation.  She would like to try oral iron supplement and return to clinic for follow-up and if she does not have any significant improvement, we will proceed with intravenous iron. Age-appropriate cancer screening discussed, she is up-to-date with mammograms, she will start colonoscopy next year.  Thank you for consulting [place] in the care of this patient.  Please not hesitate to contact us with any additional questions or concerns.  HISTORY OF PRESENTING ILLNESS:  Jamie [name] 45 y.o. female is here because of [name].  This is 45 yr old female patient with [name] related to chronic menstrual blood loss referred to hematology for recommendations regarding iron deficiency anemia.  Patient arrived to the appointment today by herself.  She complains of fatigue, shortness of breath on exertion, pica, restless legs.  She has been taking some iron Gummies for the past several months and was told that she has been iron deficient in the past. She denies any hematochezia or melena.  She reports very heavy menstrual cycles and attributes this to premenopausal time. She is willing to try oral iron supplementation, ferrous sulfate from over-the-counter.  She is not excited about having to take intravenous iron.  Last childbirth 7 years ago. No family history of colorectal cancer. Rest of the pertinent 10 point ROS reviewed and negative.   MEDICAL HISTORY:  Past Medical History:  Diagnosis Date   Abnormal Pap smear 2004   Dysmenorrhea    Vitamin D deficiency     SURGICAL HISTORY: Past Surgical History:  Procedure Laterality Date   CESAREAN SECTION N/A 09/06/2013   Procedure: Primary cesarean section with delivery of baby boy at 1136.  Apgars  9/9.;  Surgeon: Jonnie Kind, MD;  Location: [facility];  Service: Obstetrics;  Laterality: N/A;   TUBAL LIGATION  09/06/13    SOCIAL HISTORY: Social History   Socioeconomic History   Marital status: Single    Spouse name: Not on file    Number of children: Not on file   Years of education: Not on file   Highest education level: Not on file  Occupational History   Not on file  Tobacco Use   Smoking status: Never   Smokeless tobacco: Never  Vaping Use   Vaping Use: Never used  Substance and Sexual Activity   Alcohol use: Yes    Comment: occasional   Drug use: No   Sexual activity: Yes    Birth control/protection: Surgical    Comment: tubal ligation  Other Topics Concern   Not on file  Social History Narrative   Not on file   Social Determinants of Health   Financial Resource Strain: Not on file  Food Insecurity: Not on file  Transportation Needs: Not on file  Physical Activity: Not on file  Stress: Not on file  Social Connections: Not on file  Intimate Partner Violence: Not on file    FAMILY HISTORY: Family History  Problem Relation Age of Onset   Hyperlipidemia Mother    Hypertension Mother    Hypertension Father    Hyperlipidemia Father    Hyperlipidemia Maternal Grandmother    Cancer Other    Lupus Sister    Breast cancer Other     ALLERGIES:  has No Known Allergies.  MEDICATIONS:  Current Outpatient Medications  Medication Sig Dispense Refill   acetaminophen (TYLENOL) 500 MG tablet Take 500 mg by mouth every 6 (six) hours as needed.     Ascorbic Acid (VITAMIN C) 100 MG tablet Take 100 mg by mouth daily.     cholecalciferol (VITAMIN D3) 25 MCG (1000 UT) tablet Take 1,000 Units by mouth daily.     ibuprofen (ADVIL) 800 MG tablet Take 1 tablet (800 mg total) by mouth every 8 (eight) hours as needed. 30 tablet 1   norethindrone-ethinyl estradiol-FE (LOESTRIN FE) 1-20 MG-MCG tablet Take 1 tablet by mouth daily. 84 tablet 0   Vitamin D, Ergocalciferol, (DRISDOL) 1.25 MG (50000 UNIT) CAPS capsule Take 50,000 Units by mouth once a week.     No current facility-administered medications for this visit.     PHYSICAL EXAMINATION:  ECOG PERFORMANCE STATUS: 0 - Asymptomatic  There were no vitals  filed for this visit. There were no vitals filed for this visit.  GENERAL:alert, no distress and comfortable SKIN: skin color, texture, turgor are normal, no rashes or significant lesions EYES: normal, conjunctiva are pink and non-injected, sclera clear OROPHARYNX:no exudate, no erythema and lips, buccal mucosa, and tongue normal  NECK: supple, thyroid normal size, non-tender, without nodularity LYMPH:  no palpable lymphadenopathy in the cervical, axillary or inguinal LUNGS: clear to auscultation and percussion with normal breathing effort HEART: regular rate & rhythm and no murmurs and no lower extremity edema ABDOMEN:abdomen soft, non-tender and normal bowel sounds Musculoskeletal:no cyanosis of digits and no clubbing  PSYCH: alert & oriented x 3 with fluent speech NEURO: no focal motor/sensory deficits  LABORATORY DATA:  I have reviewed the data as listed Lab Results  Component Value Date   WBC 5.9 06/16/2021   HGB 9.6 (L) 06/16/2021   HCT 32.4 (L) 06/16/2021   MCV 78.1 (L) 06/16/2021   PLT 267 06/16/2021  Chemistry   No results found for: NA, K, CL, CO2, BUN, CREATININE, GLU No results found for: CALCIUM, ALKPHOS, AST, ALT, BILITOT   I reviewed her recent labs which showed severe microcytic hypochromic anemia with iron deficiency. TSH normal. Ferritin 8  RADIOGRAPHIC STUDIES: I have personally reviewed the radiological images as listed and agreed with the findings in the report. US THYROID  Result Date: 06/28/2021 CLINICAL DATA:  Nodule on physical examination EXAM: THYROID ULTRASOUND TECHNIQUE: Ultrasound examination of the thyroid gland and adjacent soft tissues was performed. COMPARISON:  None. FINDINGS: Parenchymal Echotexture: Mildly heterogeneous Isthmus: 0.5 cm Right lobe: 5.9 x 1.6 x 2.1 cm Left lobe: 4.6 x 1.4 x 2.3 cm _________________________________________________________ Estimated total number of nodules >/= 1 cm: 2 Number of spongiform nodules >/=  2 cm not  described below (TR1): 0 Number of mixed cystic and solid nodules >/= 1.5 cm not described below (TR2): 0 _________________________________________________________ Nodule # 1: Location: Isthmus; mid Maximum size: 1.4 cm; Other 2 dimensions: 1.1 x 0.7 cm Composition: solid/almost completely solid (2) Echogenicity: hypoechoic (2) Shape: not taller-than-wide (0) Margins: smooth (0) Echogenic foci: none (0) ACR TI-RADS total points: 4. ACR TI-RADS risk category: TR4 (4-6 points). ACR TI-RADS recommendations: *Given size (>/= 1 - 1.4 cm) and appearance, a follow-up ultrasound in 1 year should be considered based on TI-RADS criteria. _________________________________________________________ Nodule 2: 0.9 cm spongiform nodule in the inferior right thyroid lobe does not meet criteria for imaging surveillance or FNA. _________________________________________________________ Nodule 3: 0.8 cm solid hypoechoic nodule in the mid left thyroid lobe does not meet criteria for FNA or surveillance. _________________________________________________________ Nodule # 4: Location: Left; inferior Maximum size: 1.4 cm; Other 2 dimensions: 1.3 x 0.9 cm Composition: solid/almost completely solid (2) Echogenicity: isoechoic (1) Shape: not taller-than-wide (0) Margins: ill-defined (0) Echogenic foci: none (0) ACR TI-RADS total points: 3. ACR TI-RADS risk category: TR3 (3 points). ACR TI-RADS recommendations: Given size (<1.4 cm) and appearance, this nodule does NOT meet TI-RADS criteria for biopsy or dedicated follow-up. _________________________________________________________ IMPRESSION: Nodule 1 (TI-RADS 4) located in the mid isthmus meets criteria for imaging follow-up. Annual ultrasound surveillance is recommended until 5 years of stability is documented. The above is in keeping with the ACR TI-RADS recommendations - J Am Coll Radiol 2017;14:587-595. Electronically Signed   By: Miachel Roux M.D.   On: 06/28/2021 16:06    All questions  were answered. The patient knows to call the clinic with any problems, questions or concerns. I spent 45 minutes in the care of this patient including H and P, review of records, counseling and coordination of care.     Benay Pike, MD 07/06/2021 3:00 PM

## 2021-07-17 NOTE — Telephone Encounter (Signed)
Left message for patient to call.

## 2021-09-06 ENCOUNTER — Other Ambulatory Visit: Payer: Self-pay

## 2021-09-06 ENCOUNTER — Inpatient Hospital Stay: Payer: Commercial Managed Care - PPO | Attending: Hematology and Oncology

## 2021-09-06 DIAGNOSIS — Z803 Family history of malignant neoplasm of breast: Secondary | ICD-10-CM | POA: Diagnosis not present

## 2021-09-06 DIAGNOSIS — D500 Iron deficiency anemia secondary to blood loss (chronic): Secondary | ICD-10-CM | POA: Diagnosis not present

## 2021-09-06 DIAGNOSIS — N920 Excessive and frequent menstruation with regular cycle: Secondary | ICD-10-CM | POA: Insufficient documentation

## 2021-09-06 LAB — CBC WITH DIFFERENTIAL/PLATELET
Abs Immature Granulocytes: 0.13 10*3/uL — ABNORMAL HIGH (ref 0.00–0.07)
Basophils Absolute: 0 10*3/uL (ref 0.0–0.1)
Basophils Relative: 0 %
Eosinophils Absolute: 0 10*3/uL (ref 0.0–0.5)
Eosinophils Relative: 0 %
HCT: 36 % (ref 36.0–46.0)
Hemoglobin: 11.3 g/dL — ABNORMAL LOW (ref 12.0–15.0)
Immature Granulocytes: 2 %
Lymphocytes Relative: 25 %
Lymphs Abs: 2 10*3/uL (ref 0.7–4.0)
MCH: 22.7 pg — ABNORMAL LOW (ref 26.0–34.0)
MCHC: 31.4 g/dL (ref 30.0–36.0)
MCV: 72.4 fL — ABNORMAL LOW (ref 80.0–100.0)
Monocytes Absolute: 0.5 10*3/uL (ref 0.1–1.0)
Monocytes Relative: 6 %
Neutro Abs: 5.4 10*3/uL (ref 1.7–7.7)
Neutrophils Relative %: 67 %
Platelets: 287 10*3/uL (ref 150–400)
RBC: 4.97 MIL/uL (ref 3.87–5.11)
RDW: 22.7 % — ABNORMAL HIGH (ref 11.5–15.5)
WBC: 8.1 10*3/uL (ref 4.0–10.5)
nRBC: 0 % (ref 0.0–0.2)

## 2021-09-06 LAB — IRON AND TIBC
Iron: 60 ug/dL (ref 41–142)
Saturation Ratios: 15 % — ABNORMAL LOW (ref 21–57)
TIBC: 416 ug/dL (ref 236–444)
UIBC: 356 ug/dL (ref 120–384)

## 2021-09-06 LAB — FERRITIN: Ferritin: 38 ng/mL (ref 11–307)

## 2021-09-07 ENCOUNTER — Inpatient Hospital Stay: Payer: Commercial Managed Care - PPO | Admitting: Hematology and Oncology

## 2021-09-07 ENCOUNTER — Telehealth: Payer: Self-pay | Admitting: Hematology and Oncology

## 2021-09-07 NOTE — Telephone Encounter (Signed)
Per sch msg, patient wants to r/s todays appt. Called and left msg with new date and time

## 2021-09-13 ENCOUNTER — Telehealth: Payer: Self-pay | Admitting: Hematology and Oncology

## 2021-09-13 ENCOUNTER — Inpatient Hospital Stay: Payer: Commercial Managed Care - PPO | Admitting: Hematology and Oncology

## 2021-09-13 NOTE — Telephone Encounter (Signed)
Rescheduled per 10/4 sch msg, msg was left with pt

## 2021-09-15 ENCOUNTER — Other Ambulatory Visit: Payer: Self-pay | Admitting: Obstetrics and Gynecology

## 2021-09-15 DIAGNOSIS — Z862 Personal history of diseases of the blood and blood-forming organs and certain disorders involving the immune mechanism: Secondary | ICD-10-CM

## 2021-09-15 DIAGNOSIS — N939 Abnormal uterine and vaginal bleeding, unspecified: Secondary | ICD-10-CM

## 2021-09-15 NOTE — Telephone Encounter (Signed)
Follow up scheduled on 09/20/21, left message asking if she has enough pills until appointment.

## 2021-09-20 ENCOUNTER — Encounter: Payer: Self-pay | Admitting: Obstetrics and Gynecology

## 2021-09-20 ENCOUNTER — Other Ambulatory Visit: Payer: Self-pay

## 2021-09-20 ENCOUNTER — Ambulatory Visit: Payer: Commercial Managed Care - PPO | Admitting: Obstetrics and Gynecology

## 2021-09-20 VITALS — BP 130/64 | HR 86 | Ht 62.0 in | Wt 175.0 lb

## 2021-09-20 DIAGNOSIS — Z862 Personal history of diseases of the blood and blood-forming organs and certain disorders involving the immune mechanism: Secondary | ICD-10-CM | POA: Diagnosis not present

## 2021-09-20 DIAGNOSIS — N939 Abnormal uterine and vaginal bleeding, unspecified: Secondary | ICD-10-CM

## 2021-09-20 DIAGNOSIS — Z3041 Encounter for surveillance of contraceptive pills: Secondary | ICD-10-CM | POA: Diagnosis not present

## 2021-09-20 MED ORDER — NORETHIN ACE-ETH ESTRAD-FE 1-20 MG-MCG PO TABS: 1.0000 | ORAL_TABLET | Freq: Every day | ORAL | 2 refills | Status: DC

## 2021-09-20 NOTE — Progress Notes (Signed)
GYNECOLOGY  VISIT   HPI: 45 y.o.   Single Black or African American Not Hispanic or Latino  female   [phone] with Patient's last menstrual period was 09/13/2021.   here for a follow up to bleeding. H/O fibroid uterus, menorrhagia and anemia. She was started on OCP's 3 months ago. She says that the OCPS have helped the heaviness of her flow but she is still spotting through out the month. Cycles are monthly x 7-8, saturating a pad in 3 hours (improved from hourly). She is having some cramping, but isn't sure if it is gas or menstrual related. She has loose bowels with her cycle.    She is spotting everyday.   She had blood work with hematology a few weeks ago. Ferritin is up to 38, and hgb was up to 11.3  Addendum: 12/27/20 Ultrasound from [facility] reviewed Uterus 11.4 x 6.9 x 6.5 cm, uterus has a slightly lobular heterogeneous appearance suggesting possible intramural fibroids.  Endometrial stripe 1.4 cm Multiple cystic lesions are noted in the right adnexal region with a maximum dimension of 2.8, 3.1, and 5.0 cm Right ovary: 4 x 3.7 cm Left ovary 4.5 x 3.3 x 2.5 cm, the entire left ovary has a cystic appearance. Impression: cystic appearance of the left ovary with multiple cystic lesions in the right adnexal region, question endometriosis Uterine enlargement with possible fibroids Thickened endometrial stripe.      GYNECOLOGIC HISTORY: Patient's last menstrual period was 09/13/2021. Contraception:OCP Menopausal hormone therapy: none         OB History     Gravida  4   Para  3   Term  2   Preterm  1   AB  1   Living  3      SAB  1   IAB  0   Ectopic      Multiple      Live Births  3              Patient Active Problem List   Diagnosis Date Noted   Preterm premature rupture of membranes in third trimester 31 wk 09/03/2013    Past Medical History:  Diagnosis Date   Abnormal Pap smear 2004   Dysmenorrhea    Vitamin D deficiency      Past Surgical History:  Procedure Laterality Date   CESAREAN SECTION N/A 09/06/2013   Procedure: Primary cesarean section with delivery of baby boy at 1136.  Apgars 9/9.;  Surgeon: Jonnie Kind, MD;  Location: [facility];  Service: Obstetrics;  Laterality: N/A;   TUBAL LIGATION  09/06/13    Current Outpatient Medications  Medication Sig Dispense Refill   acetaminophen (TYLENOL) 500 MG tablet Take 500 mg by mouth every 6 (six) hours as needed.     Ascorbic Acid (VITAMIN C) 100 MG tablet Take 100 mg by mouth daily.     Ferrous Sulfate (IRON PO) Take by mouth.     ibuprofen (ADVIL) 800 MG tablet Take 1 tablet (800 mg total) by mouth every 8 (eight) hours as needed. 30 tablet 1   Vitamin D, Ergocalciferol, (DRISDOL) 1.25 MG (50000 UNIT) CAPS capsule Take 50,000 Units by mouth once a week.     norethindrone-ethinyl estradiol-FE (LOESTRIN FE) 1-20 MG-MCG tablet Take 1 tablet by mouth daily. 84 tablet 2   No current facility-administered medications for this visit.     ALLERGIES: Patient has no known allergies.  Family History  Problem Relation Age of Onset  Hyperlipidemia Mother    Hypertension Mother    Hypertension Father    Hyperlipidemia Father    Hyperlipidemia Maternal Grandmother    Cancer Other    Lupus Sister    Breast cancer Other     Social History   Socioeconomic History   Marital status: Single    Spouse name: Not on file   Number of children: Not on file   Years of education: Not on file   Highest education level: Not on file  Occupational History   Not on file  Tobacco Use   Smoking status: Never   Smokeless tobacco: Never  Vaping Use   Vaping Use: Never used  Substance and Sexual Activity   Alcohol use: Yes    Comment: occasional   Drug use: No   Sexual activity: Yes    Birth control/protection: Surgical    Comment: tubal ligation  Other Topics Concern   Not on file  Social History Narrative   Not on file   Social Determinants of Health    Financial Resource Strain: Not on file  Food Insecurity: Not on file  Transportation Needs: Not on file  Physical Activity: Not on file  Stress: Not on file  Social Connections: Not on file  Intimate Partner Violence: Not on file    Review of Systems  All other systems reviewed and are negative.  PHYSICAL EXAMINATION:    BP 130/64   Pulse 86   Ht 5\' 2"  (1.575 m)   Wt 175 lb (79.4 kg)   LMP 09/13/2021   SpO2 100%   BMI 32.01 kg/m     General appearance: alert, cooperative and appears stated age  75. Abnormal uterine bleeding (AUB) Helped with OCP's, but she continues to have daily spotting.  - norethindrone-ethinyl estradiol-FE (LOESTRIN FE) 1-20 MG-MCG tablet; Take 1 tablet by mouth daily.  Dispense: 84 tablet; Refill: 2 - [place] Sonohysterogram; Future  2. Encounter for surveillance of contraceptive pills Doing well, but continues to BTB - norethindrone-ethinyl estradiol-FE (LOESTRIN FE) 1-20 MG-MCG tablet; Take 1 tablet by mouth daily.  Dispense: 84 tablet; Refill: 2  3. History of anemia Improving with OCP's and iron

## 2021-09-20 NOTE — Progress Notes (Signed)
GYNECOLOGY  VISIT   HPI: 45 y.o.   Single Black or African American Not Hispanic or Latino  female   [phone] with Patient's last menstrual period was 09/13/2021.   here for a follow up to bleeding. H/O fibroid uterus, menorrhagia and anemia. She was started on OCP's 3 months ago. She says that the OCPS have helped the heaviness of her flow but she is still spotting through out the month. Cycles are monthly x 7-8, saturating a pad in 3 hours (improved from hourly). She is having some cramping, but isn't sure if it is gas or menstrual related. She has loose bowels with her cycle.    She is spotting everyday.   She had blood work with hematology a few weeks ago. Ferritin is up to 38, and hgb was up to 11.3  Addendum: 12/27/20 Ultrasound from [facility] reviewed Uterus 11.4 x 6.9 x 6.5 cm, uterus has a slightly lobular heterogeneous appearance suggesting possible intramural fibroids.  Endometrial stripe 1.4 cm Multiple cystic lesions are noted in the right adnexal region with a maximum dimension of 2.8, 3.1, and 5.0 cm Right ovary: 4 x 3.7 cm Left ovary 4.5 x 3.3 x 2.5 cm, the entire left ovary has a cystic appearance. Impression: cystic appearance of the left ovary with multiple cystic lesions in the right adnexal region, question endometriosis Uterine enlargement with possible fibroids Thickened endometrial stripe.      GYNECOLOGIC HISTORY: Patient's last menstrual period was 09/13/2021. Contraception:OCP Menopausal hormone therapy: none         OB History     Gravida  4   Para  3   Term  2   Preterm  1   AB  1   Living  3      SAB  1   IAB  0   Ectopic      Multiple      Live Births  3              Patient Active Problem List   Diagnosis Date Noted   Preterm premature rupture of membranes in third trimester 31 wk 09/03/2013    Past Medical History:  Diagnosis Date   Abnormal Pap smear 2004   Dysmenorrhea    Vitamin D deficiency      Past Surgical History:  Procedure Laterality Date   CESAREAN SECTION N/A 09/06/2013   Procedure: Primary cesarean section with delivery of baby boy at 1136.  Apgars 9/9.;  Surgeon: Jonnie Kind, MD;  Location: [facility];  Service: Obstetrics;  Laterality: N/A;   TUBAL LIGATION  09/06/13    Current Outpatient Medications  Medication Sig Dispense Refill   acetaminophen (TYLENOL) 500 MG tablet Take 500 mg by mouth every 6 (six) hours as needed.     Ascorbic Acid (VITAMIN C) 100 MG tablet Take 100 mg by mouth daily.     ibuprofen (ADVIL) 800 MG tablet Take 1 tablet (800 mg total) by mouth every 8 (eight) hours as needed. 30 tablet 1   norethindrone-ethinyl estradiol-FE (LOESTRIN FE) 1-20 MG-MCG tablet Take 1 tablet by mouth daily. 84 tablet 0   Vitamin D, Ergocalciferol, (DRISDOL) 1.25 MG (50000 UNIT) CAPS capsule Take 50,000 Units by mouth once a week.     No current facility-administered medications for this visit.     ALLERGIES: Patient has no known allergies.  Family History  Problem Relation Age of Onset   Hyperlipidemia Mother    Hypertension Mother    Hypertension  Father    Hyperlipidemia Father    Hyperlipidemia Maternal Grandmother    Cancer Other    Lupus Sister    Breast cancer Other     Social History   Socioeconomic History   Marital status: Single    Spouse name: Not on file   Number of children: Not on file   Years of education: Not on file   Highest education level: Not on file  Occupational History   Not on file  Tobacco Use   Smoking status: Never   Smokeless tobacco: Never  Vaping Use   Vaping Use: Never used  Substance and Sexual Activity   Alcohol use: Yes    Comment: occasional   Drug use: No   Sexual activity: Yes    Birth control/protection: Surgical    Comment: tubal ligation  Other Topics Concern   Not on file  Social History Narrative   Not on file   Social Determinants of Health   Financial Resource Strain: Not on file  Food  Insecurity: Not on file  Transportation Needs: Not on file  Physical Activity: Not on file  Stress: Not on file  Social Connections: Not on file  Intimate Partner Violence: Not on file    Review of Systems  All other systems reviewed and are negative.  PHYSICAL EXAMINATION:    BP 130/64   Pulse 86   Ht 5\' 2"  (1.575 m)   Wt 175 lb (79.4 kg)   LMP 09/13/2021   SpO2 100%   BMI 32.01 kg/m     General appearance: alert, cooperative and appears stated age  58. Abnormal uterine bleeding (AUB) Helped with OCP's, but she continues to have daily spotting.  - norethindrone-ethinyl estradiol-FE (LOESTRIN FE) 1-20 MG-MCG tablet; Take 1 tablet by mouth daily.  Dispense: 84 tablet; Refill: 2 - [place] Sonohysterogram; Future  2. Encounter for surveillance of contraceptive pills Doing well, but continues to BTB - norethindrone-ethinyl estradiol-FE (LOESTRIN FE) 1-20 MG-MCG tablet; Take 1 tablet by mouth daily.  Dispense: 84 tablet; Refill: 2  3. History of anemia Improving with OCP's and iron

## 2021-09-26 ENCOUNTER — Inpatient Hospital Stay: Payer: Commercial Managed Care - PPO | Attending: Hematology and Oncology | Admitting: Hematology and Oncology

## 2021-10-26 ENCOUNTER — Other Ambulatory Visit: Payer: Commercial Managed Care - PPO

## 2021-10-26 ENCOUNTER — Other Ambulatory Visit: Payer: Commercial Managed Care - PPO | Admitting: Obstetrics and Gynecology

## 2021-11-09 ENCOUNTER — Other Ambulatory Visit (HOSPITAL_COMMUNITY)
Admission: RE | Admit: 2021-11-09 | Discharge: 2021-11-09 | Disposition: A | Discharge status: Home / Self Care | Payer: Commercial Managed Care - PPO | Source: Ambulatory Visit | Attending: Obstetrics and Gynecology | Admitting: Obstetrics and Gynecology

## 2021-11-09 ENCOUNTER — Other Ambulatory Visit: Payer: Self-pay

## 2021-11-09 ENCOUNTER — Encounter: Payer: Self-pay | Admitting: Obstetrics and Gynecology

## 2021-11-09 ENCOUNTER — Ambulatory Visit: Payer: Commercial Managed Care - PPO | Admitting: Obstetrics and Gynecology

## 2021-11-09 ENCOUNTER — Ambulatory Visit (INDEPENDENT_AMBULATORY_CARE_PROVIDER_SITE_OTHER): Payer: Commercial Managed Care - PPO

## 2021-11-09 VITALS — BP 124/82 | HR 92 | Ht 62.0 in | Wt 175.0 lb

## 2021-11-09 DIAGNOSIS — N83202 Unspecified ovarian cyst, left side: Secondary | ICD-10-CM

## 2021-11-09 DIAGNOSIS — N939 Abnormal uterine and vaginal bleeding, unspecified: Secondary | ICD-10-CM

## 2021-11-09 DIAGNOSIS — N83201 Unspecified ovarian cyst, right side: Secondary | ICD-10-CM

## 2021-11-09 DIAGNOSIS — R030 Elevated blood-pressure reading, without diagnosis of hypertension: Secondary | ICD-10-CM | POA: Diagnosis not present

## 2021-11-09 DIAGNOSIS — Z862 Personal history of diseases of the blood and blood-forming organs and certain disorders involving the immune mechanism: Secondary | ICD-10-CM | POA: Diagnosis not present

## 2021-11-09 DIAGNOSIS — D259 Leiomyoma of uterus, unspecified: Secondary | ICD-10-CM

## 2021-11-09 HISTORY — PX: ENDOMETRIAL BIOPSY: SHX622

## 2021-11-09 MED ORDER — NORETHIN ACE-ETH ESTRAD-FE 1-20 MG-MCG PO TABS: 1.0000 | ORAL_TABLET | Freq: Every day | ORAL | 2 refills | Status: DC

## 2021-11-09 NOTE — Patient Instructions (Signed)
Endometriosis Endometriosis is a condition in which tissue that forms the lining of the uterus grows in places outside the uterus. This tissue can grow in the organs that create the eggs (ovaries), in the tubes that carry the eggs to the uterus (fallopian tubes), in the vagina, and in the bowel. This tissue most often grows on the ovaries and inner lining of the pelvic cavity (peritoneum). What are the causes? The cause of this condition is not known. What increases the risk? The following factors may make you more likely to develop this condition: Having a family history of endometriosis. Having never given birth. Starting your menstrual period at age 45 or younger. What are the signs or symptoms? Often, there are no symptoms of this condition. If you do have symptoms, they may include: Heavier bleeding during menstrual periods. Menstrual periods that happen more than once a month. Not being able to get pregnant. Pain in the area between your hip bones (pelvis). Pain during sex. Pain in the back or abdomen. Painful bowel movements and urination during menstrual periods. Rarely, you may see blood in your stool or urine. The timing of symptoms may vary, depending on where the abnormal tissue is growing. They may happen during your menstrual period (most often) or at the middle of your cycle. They may come and go. You may have no symptoms during some months. They may stop when you no longer have your monthly periods (menopause). How is this diagnosed? This condition is diagnosed based on your symptoms and a physical exam. You may also have tests, such as: Blood tests and urine tests to help rule out other causes. Ultrasound to look for tissues that are not normal. This is often done over your skin (transabdominal). It is sometimes done through the vagina (transvaginal). X-ray of the lower bowel (barium enema). CT scan. MRI. To confirm the diagnosis, your health care provider may use a  device with a small camera to check tissue inside your abdomen (laparoscopy). Abnormal tissue may be removed and checked in a lab (biopsy). How is this treated? There is no cure for this condition. The treatment goal is to control your symptoms. The type of treatment also depends on whether you want to become pregnant in the future. This condition may be treated with: Medicines. These may include: Medicines to relieve pain, including NSAIDs, such as ibuprofen. Hormone therapy, such as birth control pills, to slow the growth of abnormal tissue. Surgery to remove the abnormal tissue. During surgery, the following may happen: Tissue may be removed using a laparoscope and a laser (laparoscopic laser treatment). The ovaries, fallopian tubes, and uterus may be removed (hysterectomy). This is done in very severe cases. Follow these instructions at home: Medicines Take over-the-counter and prescription medicines only as told by your health care provider. Ask your health care provider if the medicine prescribed to you: Requires you to avoid driving or using machinery. Can cause constipation. You may need to take these actions to prevent or treat constipation: Drink enough fluid to keep your urine pale yellow. Take over-the-counter or prescription medicines. Eat foods that are high in fiber, such as beans, whole grains, and fresh fruits and vegetables. Limit foods that are high in fat and processed sugars, such as fried or sweet foods. Eating and drinking If you drink alcohol: Limit how much you have to 0-1 drink a day for women who are not pregnant. Know how much alcohol is in your drink. In the [place] one drink equals one  12 oz bottle of beer (355 mL), one 5 oz glass of wine (148 mL), or one 1 oz glass of hard liquor (44 mL). Avoid caffeine. Activity Return to your normal activities as told by your health care provider. Ask your health care provider what activities are safe for you. Do exercises as  told by your health care provider. General instructions Do not use any products that contain nicotine or tobacco. These products include cigarettes, chewing tobacco, and vaping devices, such as e-cigarettes. If you need help quitting, ask your health care provider. Keep all follow-up visits. This is important. Where to find more information [employer]: www.acog.org Office on [employer]: [place] Contact a health care provider if: You have new pain or trouble controlling pain. You have problems getting pregnant. You have a fever. Get help right away if: You have severe pain that does not get better with medicine. You have severe nausea and vomiting, or you cannot eat or drink without vomiting. You have pain in your abdomen only on the lower right side. Pain in your abdomen gets worse. You have swelling in your abdomen. You have blood in your stool. Summary Endometriosis is a condition that happens when tissue that forms the lining of the uterus grows in places outside the uterus. The cause of this condition is not known. This condition may be treated with medicines to relieve pain, hormone therapy, or surgery. If you have this condition, get regular exercise, limit alcohol use, and avoid caffeine. Get help right away if you have severe pain that does not get better with medicine, severe nausea and vomiting, pain or swelling in your abdomen, or blood in your stool. This information is not intended to replace advice given to you by your health care provider. Make sure you discuss any questions you have with your health care provider. Document Revised: 06/29/2020 Document Reviewed: 06/29/2020 Elsevier Patient Education  [employer].

## 2021-11-09 NOTE — Progress Notes (Signed)
GYNECOLOGY  VISIT   HPI: 45 y.o.   Single Black or African American Not Hispanic or Latino  female   [phone] with Patient's last menstrual period was 10/22/2021.   here for further evaluation of menorrhagia and anemia. She was seen in 10/22 to f/u on OCP's, her bleeding improved on OCP's, but she continued to have daily spotting.  Her anemia did improve on OCP's. She stopped her OCP's in 10/22.  She is on daily iron.   She does report intermittent pelvic pain with her cycle, mild.  She has intermittent, mild deep dyspareunia. Cramps vary from mild to severe. Ibuprofen takes the edge off.   Labs from 06/16/21 Pap negative, negative hpv Hgb 9.6 (up from 8.8), Ferritin <8 TSH 0.74 Prolactin 9.1 FSH 9.0  09/06/21 labs: Hgb 11.3, ferritin 38  Not smoking or vaping. No h/o htn.   U/S from 1/22: Impression: cystic appearance of the left ovary with multiple cystic lesions in the right adnexal region, question endometriosis Uterine enlargement with possible fibroids Thickened endometrial stripe. Left ovarian cyst (not measured), right ovary with 3 cysts (2.8, 3.1 and 5 cm).    GYNECOLOGIC HISTORY: Patient's last menstrual period was 10/22/2021. Contraception:TL Menopausal hormone therapy: none        OB History     Gravida  4   Para  3   Term  2   Preterm  1   AB  1   Living  3      SAB  1   IAB  0   Ectopic      Multiple      Live Births  3              Patient Active Problem List   Diagnosis Date Noted   Preterm premature rupture of membranes in third trimester 31 wk 09/03/2013    Past Medical History:  Diagnosis Date   Abnormal Pap smear 2004   Dysmenorrhea    Vitamin D deficiency     Past Surgical History:  Procedure Laterality Date   CESAREAN SECTION N/A 09/06/2013   Procedure: Primary cesarean section with delivery of baby boy at 1136.  Apgars 9/9.;  Surgeon: Jonnie Kind, MD;  Location: [facility];  Service: Obstetrics;  Laterality: N/A;    TUBAL LIGATION  09/06/13  C/S x 1  Current Outpatient Medications  Medication Sig Dispense Refill   acetaminophen (TYLENOL) 500 MG tablet Take 500 mg by mouth every 6 (six) hours as needed.     Ascorbic Acid (VITAMIN C) 100 MG tablet Take 100 mg by mouth daily.     Ferrous Sulfate (IRON PO) Take by mouth.     ibuprofen (ADVIL) 800 MG tablet Take 1 tablet (800 mg total) by mouth every 8 (eight) hours as needed. 30 tablet 1   Vitamin D, Ergocalciferol, (DRISDOL) 1.25 MG (50000 UNIT) CAPS capsule Take 50,000 Units by mouth once a week.     No current facility-administered medications for this visit.     ALLERGIES: Patient has no known allergies.  Family History  Problem Relation Age of Onset   Hyperlipidemia Mother    Hypertension Mother    Hypertension Father    Hyperlipidemia Father    Hyperlipidemia Maternal Grandmother    Cancer Other    Lupus Sister    Breast cancer Other     Social History   Socioeconomic History   Marital status: Single    Spouse name: Not on file   Number of  children: Not on file   Years of education: Not on file   Highest education level: Not on file  Occupational History   Not on file  Tobacco Use   Smoking status: Never   Smokeless tobacco: Never  Vaping Use   Vaping Use: Never used  Substance and Sexual Activity   Alcohol use: Yes    Comment: occasional   Drug use: No   Sexual activity: Yes    Birth control/protection: Surgical    Comment: tubal ligation  Other Topics Concern   Not on file  Social History Narrative   Not on file   Social Determinants of Health   Financial Resource Strain: Not on file  Food Insecurity: Not on file  Transportation Needs: Not on file  Physical Activity: Not on file  Stress: Not on file  Social Connections: Not on file  Intimate Partner Violence: Not on file    ROS  Pelvic ultrasound  Indications:  AUB  Findings:  Uterus 11.56 x 8.64 x 7.05, anteverted Fibroids: 1) 3.75 x 3.20 cm 2)  1.19 x 0.94  3) 1.95 x 1.87 cm 4) 1.48 x 1.55 cm 5) 2.23 x 1.68 cm 6) 0.66 x 0.85 cm 7) 2.19 x 2.10 cm 8) 1.42 x 1.04 cm  Endometrium 10.10 mm  Left ovary 6.32 x 4.75 x 3.82 6.13 x 3.37 cm echogenic, avascular cyst, c/w endometrioma  Right ovary 9.85 x 4.83 cm  6.27 x 4.73 cm echogenic, avascular cyst, c/w endometrioma  No free fluid  Sonohysterogram & endometrial biopsy The procedure and risks of the procedure were reviewed with the patient, consent form was signed. A speculum was placed in the vagina and the cervix was cleansed with betadine. A tenaculum was placed at 12 o'clock. The sonohysterogram catheter was inserted into the uterine cavity without difficulty. Saline was infused under direct observation with the ultrasound. No intracavitary defects were noted. The risks of endometrial biopsy were reviewed and a consent was obtained.  The uterine evacuator currete was in the endometrial cavity. The uterus sounded to ~10 cm. The endometrial biopsy was performed, taking care to get a representative sample, sampling 360 degrees of the uterine cavity. Moderate tissue was obtained. The tenaculum and speculum were removed. There were no complications.   Impression:  Enlarged, anteverted uterus with multiple intramural and subserosal fibroids (largest 3.7 cm) Endometrium 10 mm symmetrical Sonohysterogram without intracavitary defects Bilateral, echogenic, large ovarian cysts c/w endometriomas    PHYSICAL EXAMINATION:    BP (!) 160/80   Pulse 92   Ht 5\' 2"  (1.575 m)   Wt 175 lb (79.4 kg)   LMP 10/22/2021   SpO2 99%   BMI 32.01 kg/m     General appearance: alert, cooperative and appears stated age   Pelvic: External genitalia:  no lesions              Urethra:  normal appearing urethra with no masses, tenderness or lesions              Bartholins and Skenes: normal                 Vagina: normal appearing vagina with normal color and discharge, no lesions               Cervix: no lesions                Chaperone was present for exam.  1. Abnormal uterine bleeding (AUB) Fibroid uterus, no fibroids deviating the cavity, normal  sonohysterogram. Recommended she restart OCP's.  - CBC - Ferritin - norethindrone-ethinyl estradiol-FE (LOESTRIN FE) 1-20 MG-MCG tablet; Take 1 tablet by mouth daily.  Dispense: 84 tablet; Refill: 2 - Surgical pathology( [facility]/ [facility]) -Discussed option of mirena IUD -We also discussed possible hysterectomy   2. History of anemia On daily iron - CBC - Ferritin  3. Bilateral ovarian cysts -Appear to be endometriomas, 5 and 6 cm. Mild symptoms. Discussed small chance of malignant transformation. -Discussed option of close surveillance vs surgery. Surgery could be laparoscopy with cystectomies, treatment of endometriomas, +/- mirena IUD insertion or TLH, bilateral cystectomies, bilateral salpingectomies and treatment of endometriosis. -She would like to restart OCP's and have close f/u.  - US PELVIS TRANSVAGINAL NON-OB (TV ONLY); Future: in 3 months  4. Elevated BP without diagnosis of hypertension Repeat BP 124/82. Original bp done just after the procedure  5. Uterine leiomyoma, unspecified location Not deviating the uterine cavity.    In addition to reviewing the ultrasound, performing the sonohysterogram and endometrial biopsy further evaluation and counseling was done. Blood work was ordered, further imaging was ordered, she was started on OCP's. Well over 10 minutes was spent in counseling in management options.

## 2021-11-10 LAB — CBC
HCT: 38.5 % (ref 35.0–45.0)
Hemoglobin: 11.9 g/dL (ref 11.7–15.5)
MCH: 24.2 pg — ABNORMAL LOW (ref 27.0–33.0)
MCHC: 30.9 g/dL — ABNORMAL LOW (ref 32.0–36.0)
MCV: 78.4 fL — ABNORMAL LOW (ref 80.0–100.0)
Platelets: 199 10*3/uL (ref 140–400)
RBC: 4.91 10*6/uL (ref 3.80–5.10)
RDW: 15.7 % — ABNORMAL HIGH (ref 11.0–15.0)
WBC: 7.1 10*3/uL (ref 3.8–10.8)

## 2021-11-10 LAB — FERRITIN: Ferritin: 25 ng/mL (ref 16–232)

## 2021-11-13 LAB — SURGICAL PATHOLOGY

## 2022-02-08 ENCOUNTER — Ambulatory Visit (INDEPENDENT_AMBULATORY_CARE_PROVIDER_SITE_OTHER): Payer: Commercial Managed Care - PPO

## 2022-02-08 ENCOUNTER — Ambulatory Visit (INDEPENDENT_AMBULATORY_CARE_PROVIDER_SITE_OTHER): Payer: Commercial Managed Care - PPO | Admitting: Obstetrics and Gynecology

## 2022-02-08 ENCOUNTER — Other Ambulatory Visit: Payer: Self-pay

## 2022-02-08 ENCOUNTER — Encounter: Payer: Self-pay | Admitting: Obstetrics and Gynecology

## 2022-02-08 VITALS — BP 110/80 | HR 77 | Ht 62.0 in | Wt 182.0 lb

## 2022-02-08 DIAGNOSIS — Z862 Personal history of diseases of the blood and blood-forming organs and certain disorders involving the immune mechanism: Secondary | ICD-10-CM | POA: Diagnosis not present

## 2022-02-08 DIAGNOSIS — D259 Leiomyoma of uterus, unspecified: Secondary | ICD-10-CM | POA: Diagnosis not present

## 2022-02-08 DIAGNOSIS — N939 Abnormal uterine and vaginal bleeding, unspecified: Secondary | ICD-10-CM | POA: Diagnosis not present

## 2022-02-08 DIAGNOSIS — N83201 Unspecified ovarian cyst, right side: Secondary | ICD-10-CM

## 2022-02-08 DIAGNOSIS — N83202 Unspecified ovarian cyst, left side: Secondary | ICD-10-CM | POA: Diagnosis not present

## 2022-02-08 NOTE — Progress Notes (Signed)
GYNECOLOGY  VISIT ?  ?HPI: ?46 y.o.   Single Black or African American Not Hispanic or Latino  female   ?[id number] with Patient's last menstrual period was 01/20/2022.   ?here for ultrasound consult ?She has a fibroid uterus and bilateral complex ovarian cysts c/w endometriomas. ?Endometrial biopsy from 12/22 returned with proliferative endometrium.  ?She was re started on OCP's in 12/22. She is on the first week of the 3rd pack. Her bleeding is lighter on OCP's than off of OCP's.  ?No cycle in 12/22. She had some bleeding. January 30 for a day or 2, then bleed 2/11-26. She is changing a pad within 2 hours.    ?She has some pain in her right lower abdomen, off and on. She has moderate dysmenorrhea.  ? ?Last pap in 7/22: negative with negative hpv ? ?GYNECOLOGIC HISTORY: ?Patient's last menstrual period was 01/20/2022. ?Contraception:tubal ligation, OCP's  ?Menopausal hormone therapy: none  ?       ?OB History   ? ? Gravida  ?4  ? Para  ?3  ? Term  ?2  ? Preterm  ?1  ? AB  ?1  ? Living  ?3  ?  ? ? SAB  ?1  ? IAB  ?0  ? Ectopic  ?   ? Multiple  ?   ? Live Births  ?3  ?   ?  ?  ?    ? ?Patient Active Problem List  ? Diagnosis Date Noted  ? Preterm premature rupture of membranes in third trimester 31 wk 09/03/2013  ? ? ?Past Medical History:  ?Diagnosis Date  ? Abnormal Pap smear 2004  ? Dysmenorrhea   ? Vitamin D deficiency   ? ? ?Past Surgical History:  ?Procedure Laterality Date  ? CESAREAN SECTION N/A 09/06/2013  ? Procedure: Primary cesarean section with delivery of baby boy at 1136.  Apgars 9/9.;  Surgeon: Jonnie Kind, MD;  Location: [facility];  Service: Obstetrics;  Laterality: N/A;  ? TUBAL LIGATION  09/06/13  ?C/s x 1 with tubal ligations.  ? ?Current Outpatient Medications  ?Medication Sig Dispense Refill  ? acetaminophen (TYLENOL) 500 MG tablet Take 500 mg by mouth every 6 (six) hours as needed.    ? Ascorbic Acid (VITAMIN C) 100 MG tablet Take 100 mg by mouth daily.    ? Ferrous Sulfate (IRON PO) Take by mouth.     ? ibuprofen (ADVIL) 800 MG tablet Take 1 tablet (800 mg total) by mouth every 8 (eight) hours as needed. 30 tablet 1  ? norethindrone-ethinyl estradiol-FE (LOESTRIN FE) 1-20 MG-MCG tablet Take 1 tablet by mouth daily. 84 tablet 2  ? Vitamin D, Ergocalciferol, (DRISDOL) 1.25 MG (50000 UNIT) CAPS capsule Take 50,000 Units by mouth once a week.    ? ?No current facility-administered medications for this visit.  ?  ? ?ALLERGIES: Patient has no known allergies. ? ?Family History  ?Problem Relation Age of Onset  ? Hyperlipidemia Mother   ? Hypertension Mother   ? Hypertension Father   ? Hyperlipidemia Father   ? Hyperlipidemia Maternal Grandmother   ? Cancer Other   ? Lupus Sister   ? Breast cancer Other   ? ? ?Social History  ? ?Socioeconomic History  ? Marital status: Single  ?  Spouse name: Not on file  ? Number of children: Not on file  ? Years of education: Not on file  ? Highest education level: Not on file  ?Occupational History  ? Not on file  ?  Tobacco Use  ? Smoking status: Never  ? Smokeless tobacco: Never  ?Vaping Use  ? Vaping Use: Never used  ?Substance and Sexual Activity  ? Alcohol use: Yes  ?  Comment: occasional  ? Drug use: No  ? Sexual activity: Yes  ?  Birth control/protection: Surgical  ?  Comment: tubal ligation  ?Other Topics Concern  ? Not on file  ?Social History Narrative  ? Not on file  ? ?Social Determinants of Health  ? ?Financial Resource Strain: Not on file  ?Food Insecurity: Not on file  ?Transportation Needs: Not on file  ?Physical Activity: Not on file  ?Stress: Not on file  ?Social Connections: Not on file  ?Intimate Partner Violence: Not on file  ? ? ?Review of Systems  ?All other systems reviewed and are negative. ? ?Pelvic ultrasound ? ?Indications:  ? ?Findings: f/u bilateral ovarian cysts ? ?Uterus 12.72 x 7.51 x 7.46 cm ?Fibroids: ?1) 1.12 x 1.36 cm ?2) 1.77 x 1.66 cm ?3) 1.90 x 2.04 cm ?4) 1.10 x 1.05 cm ?5) 1.16 x 1.04 cm  ?6) 2.11 x 2.19 cm ?7) 2.68 x 3.03 cm ?8) 1.24 x 1.07  cm ?9) 1.32 x 1.07 cm ? ?Endometrium 9.03 mm ? ?Left ovary 5.51 x 4.61 x 2.91 cm ?Complex 5.05 x 2.68 cm cyst with internal debris (down from 6.1 x 3.3 cm) ?Appears adherent to the posterior uterus ? ?Right ovary 8.84 x 6.05 x 5.99 cm ?Complex 7.45 x 4.70 cm cyst with internal debris (increased from 6.2 x 4.7 cm) ?Appears adherent to the posterior uterus ? ?No free fluid ? ?Impression:  ?Anteverted, enlarged uterus ?Multiple intramural and subserosal fibroids, largest 2.6 x 3 cm ?Symmetrical endometrium, no masses seen ?Both ovaries are enlarged with avascular, complex cystic masses with internal debris. Both ovaries are adherent to the posterior uterus, suspicious for endometriomas.  ? ?PHYSICAL EXAMINATION:   ? ?BP 110/80   Pulse 77   Ht 5\' 2"  (1.575 m)   Wt 182 lb (82.6 kg)   LMP 01/20/2022   SpO2 100%   BMI 33.29 kg/m?     ?General appearance: alert, cooperative and appears stated age ? ?1. Abnormal uterine bleeding (AUB) ?Symptomatic fibroid uterus and bilateral complex ovarian cysts c/w endometriomas. Bleeding has improved some with OCP's, but continues to have irregular bleeding. She has a h/o severe anemia and dysmenorrhea. She desires definitive treatment.  ?-We discussed total laparoscopic hysterectomy, likely BSO, possible BS and bilateral ovarian cystectomies, discussed possible supracervical hysterectomy. She understands that the severity of her disease may change the procedure. ?-She would like to have surgery at the end of June ? ?2. Uterine leiomyoma, unspecified location ?See above ? ?3. Bilateral ovarian cysts ?See above ? ?4. History of anemia ?See above. Last hgb was 11.9 and ferritin was 25 (in 12/22) ? ?In addition to reviewing the ultrasound images, ~20 minutes was spent in patient care. The majority of this time was in counseling. ? ?

## 2022-02-13 ENCOUNTER — Encounter: Payer: Self-pay | Admitting: Obstetrics and Gynecology

## 2022-02-13 ENCOUNTER — Telehealth: Payer: Self-pay | Admitting: Obstetrics and Gynecology

## 2022-02-13 NOTE — Telephone Encounter (Signed)
Surgery: CPT 214 [id number] 0407 - Total Laparoscopic Hysterectomy with Salpingectomy and/or Oophorectomy,  treatment of endometriosis, cystoscopy ? ?Diagnosis: D25.9 Fibroids, N93.9 Abnormal Uterine Bleeding,  bilateral ovarian cysts ? ?Location: [facility] ? ?Status: Outpatient with Overnight Bed ? ?Time: 120 Minutes ? ?Assistant: Josefa Half, MD, Cherlynn June. I NEED [facility] for this case ? ?Urgency: At [employer], she desires the end of June ? ?Pre-Op Appointment: To Be Scheduled ? ?Post-Op Appointment(s): 1 Week, 4 Weeks ? ?Time Out Of Work: 6 Weeks ? ?

## 2022-02-19 NOTE — Telephone Encounter (Signed)
Call to patient. Per DPR, OK to leave message on voicemail.   Left voicemail requesting a return call to review benefits for Recommended Surgery with Jill Jertson, MD.  

## 2022-02-27 NOTE — Telephone Encounter (Signed)
Call to patient. Per DPR, OK to leave message on voicemail.   Left voicemail requesting a return call to review benefits for Recommended Surgery with Jill Jertson, MD.  

## 2022-03-08 NOTE — Telephone Encounter (Signed)
Call to patient. Per DPR, OK to leave message on voicemail.   Left voicemail requesting a return call to review benefits for Recommended Surgery with Jill Jertson, MD.  

## 2022-03-08 NOTE — Telephone Encounter (Signed)
Spoke with patient regarding surgery benefits. Patient acknowledges understanding of information presented. Patient is aware that benefits presented are professional benefits only. Patient is aware the hospital will call with facility benefits. See account note. ? ?Patient is requesting to proceed with surgery on June 13th. Informed patient that I would pass that along to Glorianne Manchester, Therapist, sports. ?

## 2022-03-12 NOTE — Telephone Encounter (Signed)
Surgery request sent for 05/22/22 ?

## 2022-03-13 NOTE — Telephone Encounter (Signed)
Left message to call Anella Nakata, RN at [facility], [phone], OPT 5.  

## 2022-03-26 NOTE — Telephone Encounter (Signed)
Left message to call Elizabeth Haff, RN at [facility], [phone].  

## 2022-03-27 NOTE — Telephone Encounter (Signed)
Spoke with patient. Surgery date request confirmed.  ?Advised surgery is scheduled for 05/22/22, [place] at 39.  ?Surgery instruction sheet and hospital brochure reviewed, printed copy will be mailed.  ?Patient verbalizes understanding and is agreeable.  ?Routing to [place].  ? ?

## 2022-03-27 NOTE — Telephone Encounter (Signed)
Incoming call transferred from Nursing Supervisor. Patient is requesting additional documentation for surgery benefits. Documentation provided via [facility]. ? ?Encounter closed. ?

## 2022-04-30 ENCOUNTER — Encounter: Payer: Self-pay | Admitting: Obstetrics and Gynecology

## 2022-04-30 ENCOUNTER — Ambulatory Visit (INDEPENDENT_AMBULATORY_CARE_PROVIDER_SITE_OTHER): Payer: Commercial Managed Care - PPO | Admitting: Obstetrics and Gynecology

## 2022-04-30 VITALS — BP 124/70 | HR 72 | Ht 62.0 in | Wt 186.0 lb

## 2022-04-30 DIAGNOSIS — D259 Leiomyoma of uterus, unspecified: Secondary | ICD-10-CM

## 2022-04-30 DIAGNOSIS — N83202 Unspecified ovarian cyst, left side: Secondary | ICD-10-CM

## 2022-04-30 DIAGNOSIS — N939 Abnormal uterine and vaginal bleeding, unspecified: Secondary | ICD-10-CM | POA: Diagnosis not present

## 2022-04-30 DIAGNOSIS — E7800 Pure hypercholesterolemia, unspecified: Secondary | ICD-10-CM

## 2022-04-30 DIAGNOSIS — N83201 Unspecified ovarian cyst, right side: Secondary | ICD-10-CM

## 2022-04-30 MED ORDER — NORETHIN ACE-ETH ESTRAD-FE 1-20 MG-MCG PO TABS: 1.0000 | ORAL_TABLET | Freq: Every day | ORAL | 0 refills | Status: DC

## 2022-04-30 NOTE — H&P (View-Only) (Signed)
GYNECOLOGY  VISIT   HPI: 46 y.o.   Single Black or African American Not Hispanic or Latino  female   [phone] with No LMP recorded.   here for a preoperative visit. She has a h/o a symptomatic fibroid uterus with menorrhagia and anemia as well as bilateral complex ovarian cysts concerning for endometriomas. Sonohysterogram without intracavitary myomas.  Cycles improved on OCPs but she was still having irregular bleeding. She ran out of pills last month.   She has intermittent, mild, deep dyspareunia.  Dysmenorrhea varies from mild to severe.   No dyschezia, no blood in her stool.   Last pap in 7/22: negative with negative hpv.  Endometrial biopsy from 12/22 returned with proliferative endometrium.  GYNECOLOGIC HISTORY: No LMP recorded. Contraception:tubal ligation Menopausal hormone therapy: NA        OB History     Gravida  4   Para  3   Term  2   Preterm  1   AB  1   Living  3      SAB  1   IAB  0   Ectopic      Multiple      Live Births  3              Patient Active Problem List   Diagnosis Date Noted   Preterm premature rupture of membranes in third trimester 31 wk 09/03/2013    Past Medical History:  Diagnosis Date   Abnormal Pap smear 2004   Dysmenorrhea    Vitamin D deficiency     Past Surgical History:  Procedure Laterality Date   CESAREAN SECTION N/A 09/06/2013   Procedure: Primary cesarean section with delivery of baby boy at 1136.  Apgars 9/9.;  Surgeon: Jonnie Kind, MD;  Location: [facility];  Service: Obstetrics;  Laterality: N/A;   TUBAL LIGATION  09/06/13  D&C for SAB  Current Outpatient Medications  Medication Sig Dispense Refill   acetaminophen (TYLENOL) 500 MG tablet Take 500 mg by mouth every 6 (six) hours as needed.     Ascorbic Acid (VITAMIN C) 100 MG tablet Take 100 mg by mouth daily.     Ferrous Sulfate (IRON PO) Take by mouth.     ibuprofen (ADVIL) 800 MG tablet Take 1 tablet (800 mg total) by mouth every 8 (eight)  hours as needed. 30 tablet 1   atorvastatin (LIPITOR) 40 MG tablet Take 40 mg by mouth daily.     Vitamin D, Ergocalciferol, (DRISDOL) 1.25 MG (50000 UNIT) CAPS capsule Take 50,000 Units by mouth once a week.     No current facility-administered medications for this visit.     ALLERGIES: Patient has no known allergies.  Family History  Problem Relation Age of Onset   Hyperlipidemia Mother    Hypertension Mother    Hypertension Father    Hyperlipidemia Father    Hyperlipidemia Maternal Grandmother    Cancer Other    Lupus Sister    Breast cancer Other     Social History   Socioeconomic History   Marital status: Single    Spouse name: Not on file   Number of children: Not on file   Years of education: Not on file   Highest education level: Not on file  Occupational History   Not on file  Tobacco Use   Smoking status: Never   Smokeless tobacco: Never  Vaping Use   Vaping Use: Never used  Substance and Sexual Activity   Alcohol use: Yes  Comment: occasional   Drug use: No   Sexual activity: Yes    Birth control/protection: Surgical    Comment: tubal ligation  Other Topics Concern   Not on file  Social History Narrative   Not on file   Social Determinants of Health   Financial Resource Strain: Not on file  Food Insecurity: Not on file  Transportation Needs: Not on file  Physical Activity: Not on file  Stress: Not on file  Social Connections: Not on file  Intimate Partner Violence: Not on file    ROS  PHYSICAL EXAMINATION:    BP 124/70   Pulse 72   Ht '5\' 2"'$  (1.575 m)   Wt 186 lb (84.4 kg)   SpO2 99%   BMI 34.02 kg/m     General appearance: alert, cooperative and appears stated age Neck: no adenopathy, supple, symmetrical, trachea midline and thyroid normal to inspection and palpation Heart: regular rate and rhythm Lungs: CTAB Abdomen: soft, non-tender; bowel sounds normal; no masses,  no organomegaly Extremities: normal, atraumatic, no  cyanosis Skin: normal color, texture and turgor, no rashes or lesions Lymph: normal cervical supraclavicular and inguinal nodes Neurologic: grossly normal   Pelvic: External genitalia:  no lesions              Urethra:  normal appearing urethra with no masses, tenderness or lesions              Bartholins and Skenes: normal                 Vagina: no speculum exam done              Cervix: no cervical motion tenderness              Bimanual Exam:  Uterus:   anteverted, enlarged, not tender              Adnexa:  unable to differentiate uterus and adnexa              Rectovaginal: Yes.  .  Confirms.              Anus:  normal sphincter tone, no lesions, no rectovaginal masses.   Chaperone was present for exam.  1. Abnormal uterine bleeding (AUB) Symptomatic fibroid uterus and bilateral complex ovarian cysts c/w endometriomas. Bleeding impoved with OCP's, but continued to be irregular. She has a h/o severe anemia and dysmenorrhea off of OCP's and desires definitive treatment. -We discussed diagnostic laparoscopy, total laparoscopic hysterectomy, possible supracervical hysterectomy, likely BSO, possible BS and bilateral ovarian cystectomies, cystoscopy. -We discussed possible laparotomy. We also discussed the possibility of not proceeding with hysterectomy if her bowel is involved. We discussed referral to [facility] or [facility].  -We discussed the risks of surgery, including but not limited to: bleeding, infection, damage to bowel/bladder/blood vessels/ureters. Discussed morcellation of the uterus in a bag and removal through her vagina or umbilicus.  -Discussed surgical menopause and need for ERT.  -All of her questions were answered at length, she would like to proceed with surgery  2. Uterine leiomyoma, unspecified location See above  3. Bilateral ovarian cysts See above

## 2022-04-30 NOTE — Progress Notes (Signed)
GYNECOLOGY  VISIT   HPI: 46 y.o.   Single Black or African American Not Hispanic or Latino  female   [phone] with No LMP recorded.   here for a preoperative visit. She has a h/o a symptomatic fibroid uterus with menorrhagia and anemia as well as bilateral complex ovarian cysts concerning for endometriomas. Sonohysterogram without intracavitary myomas.  Cycles improved on OCPs but she was still having irregular bleeding. She ran out of pills last month.   She has intermittent, mild, deep dyspareunia.  Dysmenorrhea varies from mild to severe.   No dyschezia, no blood in her stool.   Last pap in 7/22: negative with negative hpv.  Endometrial biopsy from 12/22 returned with proliferative endometrium.  GYNECOLOGIC HISTORY: No LMP recorded. Contraception:tubal ligation Menopausal hormone therapy: NA        OB History     Gravida  4   Para  3   Term  2   Preterm  1   AB  1   Living  3      SAB  1   IAB  0   Ectopic      Multiple      Live Births  3              Patient Active Problem List   Diagnosis Date Noted   Preterm premature rupture of membranes in third trimester 31 wk 09/03/2013    Past Medical History:  Diagnosis Date   Abnormal Pap smear 2004   Dysmenorrhea    Vitamin D deficiency     Past Surgical History:  Procedure Laterality Date   CESAREAN SECTION N/A 09/06/2013   Procedure: Primary cesarean section with delivery of baby boy at 1136.  Apgars 9/9.;  Surgeon: Jonnie Kind, MD;  Location: [facility];  Service: Obstetrics;  Laterality: N/A;   TUBAL LIGATION  09/06/13  D&C for SAB  Current Outpatient Medications  Medication Sig Dispense Refill   acetaminophen (TYLENOL) 500 MG tablet Take 500 mg by mouth every 6 (six) hours as needed.     Ascorbic Acid (VITAMIN C) 100 MG tablet Take 100 mg by mouth daily.     Ferrous Sulfate (IRON PO) Take by mouth.     ibuprofen (ADVIL) 800 MG tablet Take 1 tablet (800 mg total) by mouth every 8 (eight)  hours as needed. 30 tablet 1   atorvastatin (LIPITOR) 40 MG tablet Take 40 mg by mouth daily.     Vitamin D, Ergocalciferol, (DRISDOL) 1.25 MG (50000 UNIT) CAPS capsule Take 50,000 Units by mouth once a week.     No current facility-administered medications for this visit.     ALLERGIES: Patient has no known allergies.  Family History  Problem Relation Age of Onset   Hyperlipidemia Mother    Hypertension Mother    Hypertension Father    Hyperlipidemia Father    Hyperlipidemia Maternal Grandmother    Cancer Other    Lupus Sister    Breast cancer Other     Social History   Socioeconomic History   Marital status: Single    Spouse name: Not on file   Number of children: Not on file   Years of education: Not on file   Highest education level: Not on file  Occupational History   Not on file  Tobacco Use   Smoking status: Never   Smokeless tobacco: Never  Vaping Use   Vaping Use: Never used  Substance and Sexual Activity   Alcohol use: Yes  Comment: occasional   Drug use: No   Sexual activity: Yes    Birth control/protection: Surgical    Comment: tubal ligation  Other Topics Concern   Not on file  Social History Narrative   Not on file   Social Determinants of Health   Financial Resource Strain: Not on file  Food Insecurity: Not on file  Transportation Needs: Not on file  Physical Activity: Not on file  Stress: Not on file  Social Connections: Not on file  Intimate Partner Violence: Not on file    ROS  PHYSICAL EXAMINATION:    BP 124/70   Pulse 72   Ht '5\' 2"'$  (1.575 m)   Wt 186 lb (84.4 kg)   SpO2 99%   BMI 34.02 kg/m     General appearance: alert, cooperative and appears stated age Neck: no adenopathy, supple, symmetrical, trachea midline and thyroid normal to inspection and palpation Heart: regular rate and rhythm Lungs: CTAB Abdomen: soft, non-tender; bowel sounds normal; no masses,  no organomegaly Extremities: normal, atraumatic, no  cyanosis Skin: normal color, texture and turgor, no rashes or lesions Lymph: normal cervical supraclavicular and inguinal nodes Neurologic: grossly normal   Pelvic: External genitalia:  no lesions              Urethra:  normal appearing urethra with no masses, tenderness or lesions              Bartholins and Skenes: normal                 Vagina: no speculum exam done              Cervix: no cervical motion tenderness              Bimanual Exam:  Uterus:   anteverted, enlarged, not tender              Adnexa:  unable to differentiate uterus and adnexa              Rectovaginal: Yes.  .  Confirms.              Anus:  normal sphincter tone, no lesions, no rectovaginal masses.   Chaperone was present for exam.  1. Abnormal uterine bleeding (AUB) Symptomatic fibroid uterus and bilateral complex ovarian cysts c/w endometriomas. Bleeding impoved with OCP's, but continued to be irregular. She has a h/o severe anemia and dysmenorrhea off of OCP's and desires definitive treatment. -We discussed diagnostic laparoscopy, total laparoscopic hysterectomy, possible supracervical hysterectomy, likely BSO, possible BS and bilateral ovarian cystectomies, cystoscopy. -We discussed possible laparotomy. We also discussed the possibility of not proceeding with hysterectomy if her bowel is involved. We discussed referral to [facility] or [facility].  -We discussed the risks of surgery, including but not limited to: bleeding, infection, damage to bowel/bladder/blood vessels/ureters. Discussed morcellation of the uterus in a bag and removal through her vagina or umbilicus.  -Discussed surgical menopause and need for ERT.  -All of her questions were answered at length, she would like to proceed with surgery  2. Uterine leiomyoma, unspecified location See above  3. Bilateral ovarian cysts See above

## 2022-05-09 ENCOUNTER — Encounter (HOSPITAL_BASED_OUTPATIENT_CLINIC_OR_DEPARTMENT_OTHER): Payer: Self-pay | Admitting: Obstetrics and Gynecology

## 2022-05-09 ENCOUNTER — Other Ambulatory Visit: Payer: Self-pay

## 2022-05-09 NOTE — Progress Notes (Signed)
Your procedure is scheduled on Tuesday, 05/22/22.  Report to [facility].   Call this number if you have problems the morning of surgery  :[phone].   OUR ADDRESS IS [place].  [facility] ARE LOCATED IN [facility].  PLEASE BRING YOUR INSURANCE CARD AND PHOTO ID DAY OF SURGERY.  ONLY 2 PEOPLE ARE ALLOWED IN  WAITING  ROOM.                                      REMEMBER:  DO NOT EAT FOOD, CANDY GUM OR MINTS  AFTER MIDNIGHT THE NIGHT BEFORE YOUR SURGERY . YOU MAY HAVE CLEAR LIQUIDS FROM MIDNIGHT THE NIGHT BEFORE YOUR SURGERY UNTIL  4:30 AM. NO CLEAR LIQUIDS AFTER   4:30 AM DAY OF SURGERY.  YOU MAY  BRUSH YOUR TEETH MORNING OF SURGERY AND RINSE YOUR MOUTH OUT, NO CHEWING GUM CANDY OR MINTS.     CLEAR LIQUID DIET   Foods Allowed                                                                     Foods Excluded  Coffee and tea, regular and decaf                             liquids that you cannot  Plain Jell-O                                                                   see through such as: Fruit ices (not with fruit pulp)                                     milk, soups, orange juice  Plain  Popsicles                                    All solid food Carbonated beverages, regular and diet                                    Cranberry, grape and apple juices Sports drinks like Gatorade _____________________________________________________________________     TAKE THESE MEDICATIONS MORNING OF SURGERY: Lipitor, Loestrin FE    UP TO 4 VISITORS  MAY VISIT IN THE EXTENDED RECOVERY ROOM UNTIL 800 PM ONLY.  1 VISITOR AGE 18 AND OVER MAY SPEND THE NIGHT AND MUST BE IN EXTENDED RECOVERY ROOM NO LATER THAN 800 PM . YOUR DISCHARGE TIME AFTER YOU SPEND THE NIGHT IS 900 AM THE MORNING AFTER YOUR SURGERY.  YOU MAY PACK A SMALL OVERNIGHT BAG WITH TOILETRIES FOR YOUR OVERNIGHT STAY  IF YOU WISH.  YOUR PRESCRIPTION MEDICATIONS WILL BE  PROVIDED DURING [facility].                                      DO NOT WEAR JEWERLY, MAKE UP. DO NOT WEAR LOTIONS, POWDERS, PERFUMES OR NAIL POLISH ON YOUR FINGERNAILS. TOENAIL POLISH IS OK TO WEAR. DO NOT SHAVE FOR 48 HOURS PRIOR TO DAY OF SURGERY. MEN MAY SHAVE FACE AND NECK. CONTACTS, GLASSES, OR DENTURES MAY NOT BE WORN TO SURGERY.  REMEMBER: NO SMOKING, DRUGS OR ALCOHOL FOR 24 HOURS BEFORE YOUR SURGERY.                                    [facility] IS NOT RESPONSIBLE  FOR ANY BELONGINGS.                                                                    Marland Kitchen           [facility] - Preparing for Surgery Before surgery, you can play an important role.  Because skin is not sterile, your skin needs to be as free of germs as possible.  You can reduce the number of germs on your skin by washing with CHG (chlorahexidine gluconate) soap before surgery.  CHG is an antiseptic cleaner which kills germs and bonds with the skin to continue killing germs even after washing. Please DO NOT use if you have an allergy to CHG or antibacterial soaps.  If your skin becomes reddened/irritated stop using the CHG and inform your nurse when you arrive at Short Stay. Do not shave (including legs and underarms) for at least 48 hours prior to the first CHG shower.  You may shave your face/neck. Please follow these instructions carefully:  1.  Shower with CHG Soap the night before surgery and the  morning of Surgery.  2.  If you choose to wash your hair, wash your hair first as usual with your  normal  shampoo.  3.  After you shampoo, rinse your hair and body thoroughly to remove the  shampoo.                            4.  Use CHG as you would any other liquid soap.  You can apply chg directly  to the skin and wash , please wash your belly button thoroughly with chg soap provided night before and morning of your surgery.                     Gently with a scrungie or clean washcloth.  5.  Apply the CHG Soap  to your body ONLY FROM THE NECK DOWN.   Do not use on face/ open                           Wound or open sores. Avoid contact with eyes, ears mouth and genitals (private parts).  Wash face,  Genitals (private parts) with your normal soap.             6.  Wash thoroughly, paying special attention to the area where your surgery  will be performed.  7.  Thoroughly rinse your body with warm water from the neck down.  8.  DO NOT shower/wash with your normal soap after using and rinsing off  the CHG Soap.                9.  Pat yourself dry with a clean towel.            10.  Wear clean pajamas.            11.  Place clean sheets on your bed the night of your first shower and do not  sleep with pets. Day of Surgery : Do not apply any lotions/deodorants the morning of surgery.  Please wear clean clothes to the hospital/surgery center.  IF YOU HAVE ANY SKIN IRRITATION OR PROBLEMS WITH THE SURGICAL SOAP, PLEASE GET A BAR OF GOLD DIAL SOAP AND SHOWER THE NIGHT BEFORE YOUR SURGERY AND THE MORNING OF YOUR SURGERY. PLEASE LET THE NURSE KNOW MORNING OF YOUR SURGERY IF YOU HAD ANY PROBLEMS WITH THE SURGICAL SOAP.   ________________________________________________________________________                                                        QUESTIONS [facility] PRE OP NURSE PHONE [phone].

## 2022-05-09 NOTE — Progress Notes (Signed)
Spoke w/ via phone for pre-op interview---Jamie [name] needs dos---- urine pregnancy per anesthesia, surgeon orders pending as of 05/09/22              [name] results------05/18/22 [name] appt for cbc, type & screen COVID test -----patient states asymptomatic no test needed Arrive at -------0530 on Tuesday, 05/22/22 NPO after MN NO Solid Food.  Clear liquids from MN until---0430 Med rec completed Medications to take morning of surgery -----Lipitor, Loestrin FE Diabetic medication -----n/a Patient instructed no nail polish to be worn day of surgery Patient instructed to bring photo id and insurance card day of surgery Patient aware to have Driver (ride ) / caregiver    for 24 hours after surgery - boyfriend, [name] and caregiver, mother Patient Special Instructions -----Extended / overnight stay instructions given. Pre-Op special Istructions -----Requested orders from Dr. Talbert Nan via Epic IB on 05/09/22 Patient verbalized understanding of instructions that were given at this phone interview. Patient denies shortness of breath, chest pain, fever, cough at this phone interview.

## 2022-05-18 ENCOUNTER — Encounter: Payer: Self-pay | Admitting: Obstetrics and Gynecology

## 2022-05-18 ENCOUNTER — Telehealth: Payer: Self-pay | Admitting: *Deleted

## 2022-05-18 ENCOUNTER — Encounter (HOSPITAL_COMMUNITY)
Admission: RE | Admit: 2022-05-18 | Discharge: 2022-05-18 | Disposition: A | Discharge status: Home / Self Care | Payer: Commercial Managed Care - PPO | Source: Ambulatory Visit | Attending: Obstetrics and Gynecology | Admitting: Obstetrics and Gynecology

## 2022-05-18 DIAGNOSIS — Z01818 Encounter for other preprocedural examination: Secondary | ICD-10-CM

## 2022-05-18 DIAGNOSIS — Z01812 Encounter for preprocedural laboratory examination: Secondary | ICD-10-CM | POA: Insufficient documentation

## 2022-05-18 LAB — CBC
HCT: 39.4 % (ref 36.0–46.0)
Hemoglobin: 12.4 g/dL (ref 12.0–15.0)
MCH: 25.2 pg — ABNORMAL LOW (ref 26.0–34.0)
MCHC: 31.5 g/dL (ref 30.0–36.0)
MCV: 79.9 fL — ABNORMAL LOW (ref 80.0–100.0)
Platelets: 221 10*3/uL (ref 150–400)
RBC: 4.93 MIL/uL (ref 3.87–5.11)
RDW: 13.8 % (ref 11.5–15.5)
WBC: 6.9 10*3/uL (ref 4.0–10.5)
nRBC: 0 % (ref 0.0–0.2)

## 2022-05-18 NOTE — Progress Notes (Signed)
Patient aware may take birth control pill am of surgery.

## 2022-05-18 NOTE — Telephone Encounter (Signed)
FMLA forms filled out and faxed

## 2022-05-21 ENCOUNTER — Telehealth: Payer: Self-pay | Admitting: *Deleted

## 2022-05-21 NOTE — Telephone Encounter (Signed)
Spoke with patient.  Message received. Jamie [name]'s enema instructions reviewed. Questions answered.   Routing to provider for final review. Patient is agreeable to disposition. Will close encounter.

## 2022-05-21 NOTE — Telephone Encounter (Signed)
Salvadore Dom, MD  Burnice Logan, RN can you please ask her to do a fleets enema prior to surgery in the am.   Call placed to patient. Left detailed message, ok per dpr. Advised per Dr. Talbert Nan. Advised fleets enema may be purchased OTC at most any retail pharmacy. Follow instructions included in the package. Return call to [place], [place] at [facility] OPT5,  to confirm message received.

## 2022-05-21 NOTE — Anesthesia Preprocedure Evaluation (Addendum)
Anesthesia Evaluation  Patient identified by MRN, date of birth, ID band Patient awake    Reviewed: Allergy & Precautions, NPO status , Patient's Chart, lab work & pertinent test results  History of Anesthesia Complications Negative for: history of anesthetic complications  Airway Mallampati: III  TM Distance: >3 FB Neck ROM: Full    Dental  (+) Dental Advisory Given, Chipped, Loose   Pulmonary neg pulmonary ROS,    breath sounds clear to auscultation       Cardiovascular (-) anginanegative cardio ROS   Rhythm:Regular Rate:Normal     Neuro/Psych negative neurological ROS     GI/Hepatic negative GI ROS, Neg liver ROS,   Endo/Other  Morbid obesity  Renal/GU negative Renal ROS     Musculoskeletal   Abdominal (+) + obese,   Peds  Hematology negative hematology ROS (+)   Anesthesia Other Findings   Reproductive/Obstetrics                            Anesthesia Physical Anesthesia Plan  ASA: 2  Anesthesia Plan: General   Post-op Pain Management: Tylenol PO (pre-op)* and Toradol IV (intra-op)*   Induction: Intravenous  PONV Risk Score and Plan: 3 and Ondansetron, Dexamethasone and Scopolamine patch - Pre-op  Airway Management Planned: Oral ETT and Video Laryngoscope Planned  Additional Equipment: None  Intra-op Plan:   Post-operative Plan: Extubation in OR  Informed Consent: I have reviewed the patients History and Physical, chart, labs and discussed the procedure including the risks, benefits and alternatives for the proposed anesthesia with the patient or authorized representative who has indicated his/her understanding and acceptance.     Dental advisory given  Plan Discussed with: CRNA and Surgeon  Anesthesia Plan Comments:        Anesthesia Quick Evaluation

## 2022-05-22 ENCOUNTER — Encounter (HOSPITAL_BASED_OUTPATIENT_CLINIC_OR_DEPARTMENT_OTHER)
Admission: RE | Disposition: A | Discharge status: Home / Self Care | Payer: Self-pay | Source: Ambulatory Visit | Attending: Obstetrics and Gynecology

## 2022-05-22 ENCOUNTER — Ambulatory Visit (HOSPITAL_BASED_OUTPATIENT_CLINIC_OR_DEPARTMENT_OTHER): Payer: Commercial Managed Care - PPO | Admitting: Anesthesiology

## 2022-05-22 ENCOUNTER — Other Ambulatory Visit (HOSPITAL_BASED_OUTPATIENT_CLINIC_OR_DEPARTMENT_OTHER): Payer: Self-pay

## 2022-05-22 ENCOUNTER — Other Ambulatory Visit: Payer: Self-pay

## 2022-05-22 ENCOUNTER — Ambulatory Visit (HOSPITAL_BASED_OUTPATIENT_CLINIC_OR_DEPARTMENT_OTHER)
Admission: RE | Admit: 2022-05-22 | Discharge: 2022-05-22 | Disposition: A | Discharge status: Home / Self Care | Payer: Commercial Managed Care - PPO | Source: Ambulatory Visit | Attending: Obstetrics and Gynecology | Admitting: Obstetrics and Gynecology

## 2022-05-22 ENCOUNTER — Encounter (HOSPITAL_BASED_OUTPATIENT_CLINIC_OR_DEPARTMENT_OTHER): Payer: Self-pay | Admitting: Obstetrics and Gynecology

## 2022-05-22 DIAGNOSIS — D259 Leiomyoma of uterus, unspecified: Secondary | ICD-10-CM

## 2022-05-22 DIAGNOSIS — N939 Abnormal uterine and vaginal bleeding, unspecified: Secondary | ICD-10-CM | POA: Diagnosis not present

## 2022-05-22 DIAGNOSIS — D649 Anemia, unspecified: Secondary | ICD-10-CM | POA: Diagnosis not present

## 2022-05-22 DIAGNOSIS — Z90710 Acquired absence of both cervix and uterus: Secondary | ICD-10-CM | POA: Diagnosis present

## 2022-05-22 DIAGNOSIS — N83201 Unspecified ovarian cyst, right side: Secondary | ICD-10-CM | POA: Diagnosis not present

## 2022-05-22 DIAGNOSIS — N80122 Deep endometriosis of left ovary: Secondary | ICD-10-CM

## 2022-05-22 DIAGNOSIS — N920 Excessive and frequent menstruation with regular cycle: Secondary | ICD-10-CM | POA: Insufficient documentation

## 2022-05-22 DIAGNOSIS — N809 Endometriosis, unspecified: Secondary | ICD-10-CM

## 2022-05-22 DIAGNOSIS — N80123 Deep endometriosis of bilateral ovaries: Secondary | ICD-10-CM | POA: Diagnosis not present

## 2022-05-22 DIAGNOSIS — N83202 Unspecified ovarian cyst, left side: Secondary | ICD-10-CM | POA: Diagnosis not present

## 2022-05-22 DIAGNOSIS — N80201 Endometriosis of right fallopian tube, unspecified depth: Secondary | ICD-10-CM

## 2022-05-22 DIAGNOSIS — N946 Dysmenorrhea, unspecified: Secondary | ICD-10-CM | POA: Diagnosis not present

## 2022-05-22 DIAGNOSIS — Z01818 Encounter for other preprocedural examination: Secondary | ICD-10-CM

## 2022-05-22 DIAGNOSIS — Z6833 Body mass index (BMI) 33.0-33.9, adult: Secondary | ICD-10-CM | POA: Diagnosis not present

## 2022-05-22 HISTORY — PX: TOTAL LAPAROSCOPIC HYSTERECTOMY WITH BILATERAL SALPINGO OOPHORECTOMY: SHX6845

## 2022-05-22 HISTORY — DX: Presence of spectacles and contact lenses: Z97.3

## 2022-05-22 HISTORY — DX: Endometriosis, unspecified: N80.9

## 2022-05-22 HISTORY — DX: Abnormal uterine and vaginal bleeding, unspecified: N93.9

## 2022-05-22 LAB — TYPE AND SCREEN
ABO/RH(D): O POS
Antibody Screen: NEGATIVE

## 2022-05-22 LAB — COMPREHENSIVE METABOLIC PANEL
ALT: 16 U/L (ref 0–44)
AST: 17 U/L (ref 15–41)
Albumin: 3.7 g/dL (ref 3.5–5.0)
Alkaline Phosphatase: 61 U/L (ref 38–126)
Anion gap: 8 (ref 5–15)
BUN: 15 mg/dL (ref 6–20)
CO2: 23 mmol/L (ref 22–32)
Calcium: 8.6 mg/dL — ABNORMAL LOW (ref 8.9–10.3)
Chloride: 107 mmol/L (ref 98–111)
Creatinine, Ser: 0.82 mg/dL (ref 0.44–1.00)
GFR, Estimated: >60 mL/min (ref >60)
Glucose, Bld: 105 mg/dL — ABNORMAL HIGH (ref 70–99)
Potassium: 3.7 mmol/L (ref 3.5–5.1)
Sodium: 138 mmol/L (ref 135–145)
Total Bilirubin: 0.4 mg/dL (ref 0.3–1.2)
Total Protein: 7.9 g/dL (ref 6.5–8.1)

## 2022-05-22 LAB — POCT PREGNANCY, URINE: Preg Test, Ur: NEGATIVE

## 2022-05-22 SURGERY — HYSTERECTOMY, TOTAL, LAPAROSCOPIC, WITH BILATERAL SALPINGO-OOPHORECTOMY
Anesthesia: General | Site: Abdomen | Laterality: Bilateral

## 2022-05-22 MED ORDER — SODIUM CHLORIDE 0.9 % IV SOLN
2.0000 g | INTRAVENOUS | Status: AC
  Administered 2022-05-22: 2 g via INTRAVENOUS

## 2022-05-22 MED ORDER — PROPOFOL 10 MG/ML IV BOLUS
INTRAVENOUS | Status: DC | PRN
  Administered 2022-05-22: 200 mg via INTRAVENOUS
  Administered 2022-05-22: 20 mg via INTRAVENOUS

## 2022-05-22 MED ORDER — LIDOCAINE HCL (CARDIAC) PF 100 MG/5ML IV SOSY
PREFILLED_SYRINGE | INTRAVENOUS | Status: DC | PRN
  Administered 2022-05-22: 60 mg via INTRAVENOUS

## 2022-05-22 MED ORDER — LACTATED RINGERS IV SOLN: INTRAVENOUS | Status: DC

## 2022-05-22 MED ORDER — ACETAMINOPHEN 500 MG PO TABS
1000.0000 mg | ORAL_TABLET | ORAL | Status: AC
  Administered 2022-05-22: 1000 mg via ORAL

## 2022-05-22 MED ORDER — HYDROMORPHONE HCL 1 MG/ML IJ SOLN
INTRAMUSCULAR | Status: AC
  Filled 2022-05-22: qty 1

## 2022-05-22 MED ORDER — FENTANYL CITRATE (PF) 100 MCG/2ML IJ SOLN
INTRAMUSCULAR | Status: AC
  Filled 2022-05-22: qty 2

## 2022-05-22 MED ORDER — PROPOFOL 10 MG/ML IV BOLUS
INTRAVENOUS | Status: AC
  Filled 2022-05-22: qty 20

## 2022-05-22 MED ORDER — ACETAMINOPHEN 500 MG PO TABS: 1000.0000 mg | ORAL_TABLET | Freq: Four times a day (QID) | ORAL | 2 refills | Status: AC | PRN

## 2022-05-22 MED ORDER — ONDANSETRON HCL 4 MG/2ML IJ SOLN
INTRAMUSCULAR | Status: DC | PRN
  Administered 2022-05-22: 4 mg via INTRAVENOUS

## 2022-05-22 MED ORDER — OXYCODONE HCL 5 MG PO TABS: 5.0000 mg | ORAL_TABLET | ORAL | 0 refills | Status: AC | PRN

## 2022-05-22 MED ORDER — ACETAMINOPHEN 500 MG PO TABS
ORAL_TABLET | ORAL | Status: AC
  Filled 2022-05-22: qty 2

## 2022-05-22 MED ORDER — ACETAMINOPHEN 325 MG PO TABS: 1000.0000 mg | ORAL_TABLET | Freq: Once | ORAL | Status: DC

## 2022-05-22 MED ORDER — GABAPENTIN 300 MG PO CAPS
300.0000 mg | ORAL_CAPSULE | ORAL | Status: AC
  Administered 2022-05-22: 300 mg via ORAL

## 2022-05-22 MED ORDER — HYDROMORPHONE HCL 1 MG/ML IJ SOLN
0.2500 mg | INTRAMUSCULAR | Status: DC | PRN
  Administered 2022-05-22 (×3): 0.25 mg via INTRAVENOUS

## 2022-05-22 MED ORDER — SODIUM CHLORIDE 0.9 % IR SOLN
Status: DC | PRN
  Administered 2022-05-22: 500 mL

## 2022-05-22 MED ORDER — MIDAZOLAM HCL 2 MG/2ML IJ SOLN: 0.5000 mg | Freq: Once | INTRAMUSCULAR | Status: DC | PRN

## 2022-05-22 MED ORDER — MEPERIDINE HCL 25 MG/ML IJ SOLN: 6.2500 mg | INTRAMUSCULAR | Status: DC | PRN

## 2022-05-22 MED ORDER — ONDANSETRON HCL 4 MG/2ML IJ SOLN
INTRAMUSCULAR | Status: AC
  Filled 2022-05-22: qty 2

## 2022-05-22 MED ORDER — FENTANYL CITRATE (PF) 250 MCG/5ML IJ SOLN
INTRAMUSCULAR | Status: AC
  Filled 2022-05-22: qty 5

## 2022-05-22 MED ORDER — DEXAMETHASONE SODIUM PHOSPHATE 4 MG/ML IJ SOLN
INTRAMUSCULAR | Status: DC | PRN
  Administered 2022-05-22: 10 mg via INTRAVENOUS

## 2022-05-22 MED ORDER — BUPIVACAINE HCL (PF) 0.25 % IJ SOLN
INTRAMUSCULAR | Status: DC | PRN
  Administered 2022-05-22: 10 mL

## 2022-05-22 MED ORDER — SUGAMMADEX SODIUM 200 MG/2ML IV SOLN
INTRAVENOUS | Status: DC | PRN
  Administered 2022-05-22: 200 mg via INTRAVENOUS

## 2022-05-22 MED ORDER — ENOXAPARIN SODIUM 40 MG/0.4ML IJ SOSY
40.0000 mg | PREFILLED_SYRINGE | INTRAMUSCULAR | Status: AC
  Administered 2022-05-22: 40 mg via SUBCUTANEOUS

## 2022-05-22 MED ORDER — SODIUM CHLORIDE 0.9 % IV SOLN
INTRAVENOUS | Status: AC
  Filled 2022-05-22: qty 2

## 2022-05-22 MED ORDER — 0.9 % SODIUM CHLORIDE (POUR BTL) OPTIME
TOPICAL | Status: DC | PRN
  Administered 2022-05-22: 500 mL

## 2022-05-22 MED ORDER — MIDAZOLAM HCL 5 MG/5ML IJ SOLN
INTRAMUSCULAR | Status: DC | PRN
  Administered 2022-05-22: 2 mg via INTRAVENOUS

## 2022-05-22 MED ORDER — ROCURONIUM BROMIDE 100 MG/10ML IV SOLN
INTRAVENOUS | Status: DC | PRN
  Administered 2022-05-22: 20 mg via INTRAVENOUS
  Administered 2022-05-22: 60 mg via INTRAVENOUS

## 2022-05-22 MED ORDER — LIDOCAINE HCL (PF) 2 % IJ SOLN
INTRAMUSCULAR | Status: AC
  Filled 2022-05-22: qty 5

## 2022-05-22 MED ORDER — MIDAZOLAM HCL 2 MG/2ML IJ SOLN
INTRAMUSCULAR | Status: AC
  Filled 2022-05-22: qty 2

## 2022-05-22 MED ORDER — GABAPENTIN 300 MG PO CAPS
ORAL_CAPSULE | ORAL | Status: AC
  Filled 2022-05-22: qty 1

## 2022-05-22 MED ORDER — SCOPOLAMINE 1 MG/3DAYS TD PT72
1.0000 | MEDICATED_PATCH | TRANSDERMAL | Status: DC
  Administered 2022-05-22: 1.5 mg via TRANSDERMAL

## 2022-05-22 MED ORDER — OXYCODONE HCL 5 MG/5ML PO SOLN: 5.0000 mg | Freq: Once | ORAL | Status: DC | PRN

## 2022-05-22 MED ORDER — IBUPROFEN 800 MG PO TABS: 800.0000 mg | ORAL_TABLET | Freq: Three times a day (TID) | ORAL | 0 refills | Status: AC | PRN

## 2022-05-22 MED ORDER — ROCURONIUM BROMIDE 10 MG/ML (PF) SYRINGE
PREFILLED_SYRINGE | INTRAVENOUS | Status: AC
  Filled 2022-05-22: qty 10

## 2022-05-22 MED ORDER — SCOPOLAMINE 1 MG/3DAYS TD PT72
MEDICATED_PATCH | TRANSDERMAL | Status: AC
  Filled 2022-05-22: qty 1

## 2022-05-22 MED ORDER — SODIUM CHLORIDE (PF) 0.9 % IJ SOLN: INTRAMUSCULAR | Status: DC | PRN

## 2022-05-22 MED ORDER — FENTANYL CITRATE (PF) 100 MCG/2ML IJ SOLN
INTRAMUSCULAR | Status: DC | PRN
  Administered 2022-05-22: 100 ug via INTRAVENOUS
  Administered 2022-05-22 (×5): 50 ug via INTRAVENOUS

## 2022-05-22 MED ORDER — KETOROLAC TROMETHAMINE 30 MG/ML IJ SOLN
INTRAMUSCULAR | Status: DC | PRN
  Administered 2022-05-22: 30 mg via INTRAVENOUS

## 2022-05-22 MED ORDER — POVIDONE-IODINE 10 % EX SWAB
2.0000 "application " | Freq: Once | CUTANEOUS | Status: AC
  Administered 2022-05-22: 2 via TOPICAL

## 2022-05-22 MED ORDER — ENOXAPARIN SODIUM 40 MG/0.4ML IJ SOSY
PREFILLED_SYRINGE | INTRAMUSCULAR | Status: AC
  Filled 2022-05-22: qty 0.4

## 2022-05-22 MED ORDER — OXYCODONE HCL 5 MG PO TABS: 5.0000 mg | ORAL_TABLET | Freq: Once | ORAL | Status: DC | PRN

## 2022-05-22 MED ORDER — DEXAMETHASONE SODIUM PHOSPHATE 10 MG/ML IJ SOLN
INTRAMUSCULAR | Status: AC
  Filled 2022-05-22: qty 1

## 2022-05-22 SURGICAL SUPPLY — 78 items
ADH SKN CLS APL DERMABOND .7 (GAUZE/BANDAGES/DRESSINGS) ×2
APL ESCP 73.6OZ SRGCL (TIP)
APL SRG 38 LTWT LNG FL B (MISCELLANEOUS)
APL SWBSTK 6 STRL LF DISP (MISCELLANEOUS) ×2
APPLICATOR ARISTA FLEXITIP XL (MISCELLANEOUS) IMPLANT
APPLICATOR COTTON TIP 6 STRL (MISCELLANEOUS) ×1 IMPLANT
APPLICATOR COTTON TIP 6IN STRL (MISCELLANEOUS) ×3
BLADE SURG 10 STRL SS (BLADE) IMPLANT
CABLE HIGH FREQUENCY MONO STRZ (ELECTRODE) IMPLANT
CELL SAVER LIPIGURD (MISCELLANEOUS) IMPLANT
CNTNR URN SCR LID CUP LEK RST (MISCELLANEOUS) ×1 IMPLANT
CONT SPEC 4OZ STRL OR WHT (MISCELLANEOUS) ×3
COVER BACK TABLE 60X90IN (DRAPES) ×2 IMPLANT
COVER MAYO STAND STRL (DRAPES) ×3 IMPLANT
DECANTER SPIKE VIAL GLASS SM (MISCELLANEOUS) ×1 IMPLANT
DERMABOND ADVANCED (GAUZE/BANDAGES/DRESSINGS) ×1
DERMABOND ADVANCED .7 DNX12 (GAUZE/BANDAGES/DRESSINGS) ×2 IMPLANT
DEVICE RETRIEVAL ALEXIS 14 (MISCELLANEOUS) IMPLANT
DRSG COVADERM PLUS 2X2 (GAUZE/BANDAGES/DRESSINGS) ×3 IMPLANT
DRSG OPSITE POSTOP 3X4 (GAUZE/BANDAGES/DRESSINGS) ×1 IMPLANT
DURAPREP 26ML APPLICATOR (WOUND CARE) ×3 IMPLANT
EXTRT SYSTEM ALEXIS 14CM (MISCELLANEOUS)
EXTRT SYSTEM ALEXIS 17CM (MISCELLANEOUS)
GAUZE 4X4 16PLY ~~LOC~~+RFID DBL (SPONGE) ×6 IMPLANT
GLOVE BIO SURGEON STRL SZ 6.5 (GLOVE) ×6 IMPLANT
GLOVE BIOGEL PI IND STRL 7.0 (GLOVE) ×8 IMPLANT
GLOVE BIOGEL PI INDICATOR 7.0 (GLOVE) ×4
GOWN STRL REUS W/TWL LRG LVL3 (GOWN DISPOSABLE) ×12 IMPLANT
HARMONIC RUM II 2.5CM SILVER (DISPOSABLE)
HARMONIC RUM II 3.0CM SILVER (DISPOSABLE) ×3
HARMONIC RUM II 3.5CM SILVER (DISPOSABLE)
HARMONIC RUM II 4.0CM SILVER (DISPOSABLE)
HEMOSTAT ARISTA ABSORB 3G PWDR (HEMOSTASIS) IMPLANT
HIBICLENS CHG 4% 4OZ BTL (MISCELLANEOUS) ×4 IMPLANT
IV NS 1000ML (IV SOLUTION) ×3
IV NS 1000ML BAXH (IV SOLUTION) ×1 IMPLANT
KIT TURNOVER CYSTO (KITS) ×3 IMPLANT
LIGASURE VESSEL 5MM BLUNT TIP (ELECTROSURGICAL) ×3 IMPLANT
NEEDLE INSUFFLATION 120MM (ENDOMECHANICALS) ×3 IMPLANT
NS IRRIG 500ML POUR BTL (IV SOLUTION) ×2 IMPLANT
PACK LAPAROSCOPY BASIN (CUSTOM PROCEDURE TRAY) ×3 IMPLANT
PACK TRENDGUARD 450 HYBRID PRO (MISCELLANEOUS) ×1 IMPLANT
POUCH LAPAROSCOPIC INSTRUMENT (MISCELLANEOUS) ×3 IMPLANT
POWDER SURGICEL 3.0 GRAM (HEMOSTASIS) IMPLANT
PROTECTOR NERVE ULNAR (MISCELLANEOUS) ×2 IMPLANT
RETRACTOR WOUND ALXS 19CM XSML (INSTRUMENTS) IMPLANT
RTRCTR WOUND ALEXIS 19CM XSML (INSTRUMENTS)
SCALPEL HRMNC RUM II 2.5 SILVR (DISPOSABLE) IMPLANT
SCALPEL HRMNC RUM II 3.0 SILVR (DISPOSABLE) ×1 IMPLANT
SCALPEL HRMNC RUM II 3.5 SILVR (DISPOSABLE) IMPLANT
SCALPEL HRMNC RUM II 4.0 SILVR (DISPOSABLE) IMPLANT
SCISSORS LAP 5X35 DISP (ENDOMECHANICALS) IMPLANT
SET IRRIG Y TYPE TUR BLADDER L (SET/KITS/TRAYS/PACK) ×3 IMPLANT
SET SUCTION IRRIG HYDROSURG (IRRIGATION / IRRIGATOR) ×3 IMPLANT
SET TRI-LUMEN FLTR TB AIRSEAL (TUBING) ×3 IMPLANT
SHEARS 1100 HARMONIC 36 (ELECTROSURGICAL) ×3 IMPLANT
SUT STRATAFIX PDS+0 CT1 9 (SUTURE) IMPLANT
SUT VIC AB 0 CT1 36 (SUTURE) ×5 IMPLANT
SUT VIC AB 4-0 PS2 18 (SUTURE) ×3 IMPLANT
SUT VICRYL 0 UR6 27IN ABS (SUTURE) ×2 IMPLANT
SUT VLOC 180 0 9IN  GS21 (SUTURE) ×1
SUT VLOC 180 0 9IN GS21 (SUTURE) ×1 IMPLANT
SYR 50ML LL SCALE MARK (SYRINGE) ×6 IMPLANT
SYSTEM CONTND EXTRCTN KII BLLN (MISCELLANEOUS) IMPLANT
TIP ENDOSCOPIC SURGICEL (TIP) IMPLANT
TIP RUMI ORANGE 6.7MMX12CM (TIP) IMPLANT
TIP UTERINE 5.1X6CM LAV DISP (MISCELLANEOUS) IMPLANT
TIP UTERINE 6.7X10CM GRN DISP (MISCELLANEOUS) ×2 IMPLANT
TIP UTERINE 6.7X6CM WHT DISP (MISCELLANEOUS) IMPLANT
TIP UTERINE 6.7X8CM BLUE DISP (MISCELLANEOUS) IMPLANT
TOWEL OR 17X26 10 PK STRL BLUE (TOWEL DISPOSABLE) ×3 IMPLANT
TRAY FOLEY W/BAG SLVR 14FR LF (SET/KITS/TRAYS/PACK) ×3 IMPLANT
TRENDGUARD 450 HYBRID PRO PACK (MISCELLANEOUS) ×3
TROCAR ADV FIXATION 5X100MM (TROCAR) ×5 IMPLANT
TROCAR BLADELESS OPT 5 100 (ENDOMECHANICALS) ×3 IMPLANT
TROCAR PORT AIRSEAL 5X120 (TROCAR) ×3 IMPLANT
TROCAR XCEL NON BLADE 8MM B8LT (ENDOMECHANICALS) ×3 IMPLANT
WARMER LAPAROSCOPE (MISCELLANEOUS) ×3 IMPLANT

## 2022-05-22 NOTE — Interval H&P Note (Signed)
History and Physical Interval Note:  05/22/2022 7:20 AM  Jamie [name]  has presented today for surgery, with the diagnosis of Fibroids, Abnormal uterine bleeding, bilateral ovairan cysts.  The various methods of treatment have been discussed with the patient and family. After consideration of risks, benefits and other options for treatment, the patient has consented to  Procedure(s): TOTAL LAPAROSCOPIC HYSTERECTOMY WITH BILATERAL SALPINGO OOPHORECTOMY, with treatment of endometriosis (Bilateral) CYSTOSCOPY (N/A) as a surgical intervention.  The patient's history has been reviewed, patient examined, no change in status, stable for surgery.  I have reviewed the patient's chart and labs.  Questions were answered to the patient's satisfaction.     Salvadore Dom

## 2022-05-22 NOTE — Discharge Instructions (Signed)

## 2022-05-22 NOTE — Transfer of Care (Signed)
Immediate Anesthesia Transfer of Care Note  Patient: Jamie [name]  Procedure(s) Performed: Procedure(s) (LRB): DIAGNOSTIC LAPAROSCOPY; LYSIS OF ADHESIONS; BILATERAL ENDOMETRIOMAS (Bilateral)  Patient Location: PACU  Anesthesia Type: General  Level of Consciousness: awake, sedated, patient cooperative and responds to stimulation  Airway & Oxygen Therapy: Patient Spontanous Breathing and Patient connected to [place] 02   Post-op Assessment: Report given to PACU RN, Post -op Vital signs reviewed and stable and Patient moving all extremities  Post vital signs: Reviewed and stable  Complications: No apparent anesthesia complications

## 2022-05-22 NOTE — Anesthesia Procedure Notes (Signed)
Procedure Name: Intubation Date/Time: 05/22/2022 7:50 AM  Performed by: Justice Rocher, CRNAPre-anesthesia Checklist: Patient identified, Emergency Drugs available, Suction available, Patient being monitored and Timeout performed Patient Re-evaluated:Patient Re-evaluated prior to induction Oxygen Delivery Method: Circle system utilized Preoxygenation: Pre-oxygenation with 100% oxygen Induction Type: IV induction Ventilation: Mask ventilation without difficulty Laryngoscope Size: Mac and 3 Grade View: Grade II Tube type: Oral Tube size: 7.0 mm Number of attempts: 1 Airway Equipment and Method: Stylet and Oral airway Placement Confirmation: ETT inserted through vocal cords under direct vision, positive ETCO2, breath sounds checked- equal and bilateral and CO2 detector Secured at: 24 cm Tube secured with: Tape Dental Injury: Teeth and Oropharynx as per pre-operative assessment

## 2022-05-22 NOTE — Anesthesia Postprocedure Evaluation (Signed)
Anesthesia Post Note  Patient: Golden Pop  Procedure(s) Performed: DIAGNOSTIC LAPAROSCOPY; LYSIS OF ADHESIONS; BILATERAL ENDOMETRIOMAS (Bilateral: Abdomen)     Patient location during evaluation: PACU Anesthesia Type: General Level of consciousness: awake and alert, patient cooperative and oriented Pain management: pain level controlled Vital Signs Assessment: post-procedure vital signs reviewed and stable Respiratory status: spontaneous breathing, nonlabored ventilation and respiratory function stable Cardiovascular status: blood pressure returned to baseline and stable Postop Assessment: no apparent nausea or vomiting, able to ambulate and adequate PO intake Anesthetic complications: no   No notable events documented.  Last Vitals:  Vitals:   05/22/22 1015 05/22/22 1028  BP: (!) 124/91 124/89  Pulse: 76 77  Resp: (!) 22 16  Temp:  36.7 C  SpO2: 95% 95%    Last Pain:  Vitals:   05/22/22 1028  TempSrc:   PainSc: 0-No pain                 Isla Sabree,E. Gunther Zawadzki

## 2022-05-22 NOTE — Op Note (Signed)
Preoperative Diagnosis: symptomatic fibroid uterus, bilateral complex ovarian cysts  Postoperative Diagnosis: symptomatic fibroid uterus, left ovarian endometrioma, right tubal endometrioma, stage 4 endometriosis  Procedure:  Diagnostic laparoscopy, lysis of adhesions, drainage of bilateral endometriomas  Surgeon: Dr Sumner Boast  Assistant: Dr Josefa Half, an MD assistant was necessary for tissue manipulation, retraction and positioning due to the complexity of the case and hospital policies  Anesthesia: General  EBL: 10 cc  Fluids: 1,700 cc  Urine output: 100 cc   Indications for surgery: The patient is a 46 year old female, with a symptomatic fibroid uterus with menorrhagia and anemia as well as bilateral complex ovarian cysts. Work up included an ultrasound with multiple small myomas and bilateral complex ovarian cysts, a negative sonohysterography, a benign endometrial biopsy, and a normal pap. Cycles improved on OCP's, but she continued to have irregular bleeding. She desires definitive treatment.  The patient is aware of the risks and complications involved with the surgery and consent was obtained prior to the procedure. We discussed multiple possible scenarios depending on the severity of her endometriosis. The patient did an enema the morning of surgery.   Findings: slightly enlarged fibroid uterus, normal appearing right ovary, evidence of bilateral tubal ligations, right tube was enlarged and cystic, it was filled with old blood and adhesed to the rectum and the back of the cervix/uterus, the left ovary had an endometrioma and was adhesed to the posterior uterus.  Normal liver edge. No endometriosis noted over the bladder.   Procedure: The patient was taken to the operating room with an IV in placed, preoperative antibiotics and lovenox had been administered. She was placed in the dorsal lithotomy position. General anesthesia was administered. She was prepped and draped in the  usual sterile fashion for an abdominal, vaginal surgery. A rumi uterine manipulator was placed, using a # 3 cup and a 10 cm extender. A foley catheter was placed.    The umbilicus was everted, injected with 0.25% marcaine and incised with a # 11 blade. 2 towel clips were used to elevated the umbilicus and a veress needle was placed into the abdominal cavity. The abdominal cavity was insufflated with CO2, with normal intraabdominal pressures. After adequate pneumo-insufflation the veress needle was removed and the 5 mm laparoscope was placed into the abdominal cavity using the opti-view trocar. The patient was placed in trendelenburg and the abdominal pelvic cavity was inspected. 3 more trocars were placed: 1 in each lower quadrant approximately 3 cm medial to and superior to the anterior superior iliac spine and one in the midline approximately 6 cm above the pubic symphysis in the midline. These areas were injected with 0.25% marcaine, incised with a #11 blade and all trocars were inserted with direct visualization with the laparoscope. A # 5 airseal trocar was placed in the RLQ, a 5 mm trocar in the LLQ and a #8 trocar in the midline. The abdominal pelvic cavity was again inspected.   The pelvic anatomy was careful evaluated, the left ovary was partially dissected off of the uterus with blunt dissection. The left endometrioma was drained during this process. The right tube was cystic and enlarged, limiting visualization. The tube was opened with the harmonic scalpel and drained, it was partially dissected off of the posterior uterus with blunt dissection. It was difficult to tell where the right tube ended and the rectum started, a rectal probe was placed into the rectum, a clear plan was still not delineated. The right tube was also adhesed  to the posterior uterus and cervix. A clear plan was not seen. Given concern for the risk of rectal injury, the decision was made to not proceed with hysterectomy. The  patient will be referred to Oncology or an Endometriosis Center where a Colorectal surgeon can be available.   There was some oozing on the uterine serosa from where the adhesions had been taken down, this was stopped by coagulating with the ligasure device. The pelvis was irrigated and suctioned dry. Hemostasis was excellent. Pressure was released and hemostasis remained excellent.   The abdominal cavity was desufflated and the trocars were removed. The skin was closed with subcuticular stiches of 4-0 vicryl and dermabond was placed over the incisions.  The foley catheter was removed and cystoscopy was performed using a 70 degree scope. Both ureters expelled urine, no bladder abnormalities were noted. The bladder was allowed to drain and the cystoscope was removed.   The patient's abdomen and perineum were cleansed and she was taken out of the dorsal lithotomy position. Upon awakening she was extubated and taken to the recovery room in stable condition. The sponge and instrument counts were correct.

## 2022-05-23 ENCOUNTER — Encounter (HOSPITAL_BASED_OUTPATIENT_CLINIC_OR_DEPARTMENT_OTHER): Payer: Self-pay | Admitting: Obstetrics and Gynecology

## 2022-05-29 ENCOUNTER — Encounter: Payer: Self-pay | Admitting: Obstetrics and Gynecology

## 2022-05-29 ENCOUNTER — Ambulatory Visit (INDEPENDENT_AMBULATORY_CARE_PROVIDER_SITE_OTHER): Payer: Commercial Managed Care - PPO | Admitting: Obstetrics and Gynecology

## 2022-05-29 VITALS — BP 118/72 | HR 72 | Ht 62.0 in | Wt 183.0 lb

## 2022-05-29 DIAGNOSIS — N939 Abnormal uterine and vaginal bleeding, unspecified: Secondary | ICD-10-CM

## 2022-05-29 DIAGNOSIS — D259 Leiomyoma of uterus, unspecified: Secondary | ICD-10-CM

## 2022-05-29 DIAGNOSIS — Z9889 Other specified postprocedural states: Secondary | ICD-10-CM

## 2022-05-29 DIAGNOSIS — N809 Endometriosis, unspecified: Secondary | ICD-10-CM

## 2022-05-29 NOTE — Progress Notes (Signed)
GYNECOLOGY  VISIT   HPI: 46 y.o.   Single Black or African American Not Hispanic or Latino  female   [phone] with Patient's last menstrual period was 05/21/2022 (exact date).   here for a post operative visit. On 05/22/22 she underwent diagnostic laparoscopy, lysis of adhesions and drainage of bilateral endometriomas.    Findings: slightly enlarged fibroid uterus, normal appearing right ovary, evidence of bilateral tubal ligations, right tube was enlarged and cystic, it was filled with old blood and adhesed to the rectum and the back of the cervix/uterus, the left ovary had an endometrioma and was adhesed to the posterior uterus.  Normal liver edge. No endometriosis noted over the bladder.   With use of a rectal probe the rectum could not be distinguished from the right tube, which was adherent to the posterior cervix/uterus. Given the concern for rectal injury the planned hysterectomy was not completed.   GYNECOLOGIC HISTORY: Patient's last menstrual period was 05/21/2022 (exact date). Contraception:tubal ligation Menopausal hormone therapy: none        OB History     Gravida  4   Para  3   Term  2   Preterm  1   AB  1   Living  3      SAB  1   IAB  0   Ectopic      Multiple      Live Births  3              Patient Active Problem List   Diagnosis Date Noted   S/P laparoscopic hysterectomy 05/22/2022   Elevated cholesterol 04/30/2022   Preterm premature rupture of membranes in third trimester 31 wk 09/03/2013    Past Medical History:  Diagnosis Date   Abnormal Pap smear 2004   Abnormal uterine bleeding    Bilateral ovarian cysts 2022   bilateral complex ovarian cysts   Dysmenorrhea    Elevated cholesterol    Endometriosis    Iron deficiency anemia 2022   due to chronic blood loss / heavy bleeding, Hgb 11.3 on 09/06/21 and Hgb 9.6 on 06/16/21. Currently on iron supplementation as of 05/09/2021.   Vitamin D deficiency    Wears glasses    reading only     Past Surgical History:  Procedure Laterality Date   CESAREAN SECTION N/A 09/06/2013   Procedure: Primary cesarean section with delivery of baby boy at 1136.  Apgars 9/9.;  Surgeon: Jonnie Kind, MD;  Location: [facility];  Service: Obstetrics;  Laterality: N/A;   ENDOMETRIAL BIOPSY  11/2021   proliferative endometrium   suction D&C  2010   SAB   TOTAL LAPAROSCOPIC HYSTERECTOMY WITH BILATERAL SALPINGO OOPHORECTOMY Bilateral 05/22/2022   Procedure: DIAGNOSTIC LAPAROSCOPY; LYSIS OF ADHESIONS; BILATERAL ENDOMETRIOMAS;  Surgeon: Salvadore Dom, MD;  Location: [facility];  Service: Gynecology;  Laterality: Bilateral;   TUBAL LIGATION  09/06/2013    Current Outpatient Medications  Medication Sig Dispense Refill   acetaminophen (TYLENOL) 500 MG tablet Take 2 tablets (1,000 mg total) by mouth every 6 (six) hours as needed. 100 tablet 2   atorvastatin (LIPITOR) 40 MG tablet Take 40 mg by mouth daily.     Ferrous Sulfate (IRON PO) Take by mouth daily.     ibuprofen (ADVIL) 800 MG tablet Take 1 tablet (800 mg total) by mouth every 8 (eight) hours as needed. 30 tablet 0   Magnesium 250 MG TABS Take by mouth.     Multiple Vitamins-Minerals (HAIR SKIN  AND NAILS FORMULA PO) Take by mouth.     norethindrone-ethinyl estradiol-FE (LOESTRIN FE) 1-20 MG-MCG tablet Take 1 tablet by mouth daily. 28 tablet 0   Vitamin D, Ergocalciferol, (DRISDOL) 1.25 MG (50000 UNIT) CAPS capsule Take 50,000 Units by mouth once a week.     No current facility-administered medications for this visit.     ALLERGIES: Patient has no known allergies.  Family History  Problem Relation Age of Onset   Hyperlipidemia Mother    Hypertension Mother    Hypertension Father    Hyperlipidemia Father    Hyperlipidemia Maternal Grandmother    Cancer Other    Lupus Sister    Breast cancer Other     Social History   Socioeconomic History   Marital status: Single    Spouse name: Not on file   Number of  children: Not on file   Years of education: Not on file   Highest education level: Not on file  Occupational History   Not on file  Tobacco Use   Smoking status: Never   Smokeless tobacco: Never  Vaping Use   Vaping Use: Former   Quit date: 12/11/2015  Substance and Sexual Activity   Alcohol use: Yes    Comment: 2 drinks on the weekend sometimes   Drug use: No   Sexual activity: Yes    Birth control/protection: Surgical, Pill    Comment: tubal ligation  Other Topics Concern   Not on file  Social History Narrative   Not on file   Social Determinants of Health   Financial Resource Strain: Not on file  Food Insecurity: Not on file  Transportation Needs: Not on file  Physical Activity: Not on file  Stress: Not on file  Social Connections: Not on file  Intimate Partner Violence: Not on file    ROS  PHYSICAL EXAMINATION:    LMP 05/21/2022 (Exact Date)     General appearance: alert, cooperative and appears stated age Abdomen: soft, non-tender; non distended, no masses,  no organomegaly Incisions: healing well  1. Abnormal uterine bleeding (AUB) Desires hysterectomy, not completed last week secondary to concern of risk of rectal injury Will refer to [place]  2. Uterine leiomyoma, unspecified location  3. Endometriosis Stage 4 endometriosis  4. S/P laparoscopy Healing well

## 2022-05-30 ENCOUNTER — Telehealth: Payer: Self-pay

## 2022-05-30 DIAGNOSIS — N809 Endometriosis, unspecified: Secondary | ICD-10-CM

## 2022-05-30 NOTE — Telephone Encounter (Signed)
-----   Message from Jamie Dom, MD sent at 05/29/2022  3:54 PM EDT ----- Please refer this patient to [facility]. H/O stage 4 endometriosis. Recent planned hysterectomy not completed secondary to concern for rectal injury. Desires hysterectomy. Thanks, [name]

## 2022-05-30 NOTE — Telephone Encounter (Signed)
Referral faxed successfully.  

## 2022-06-05 ENCOUNTER — Other Ambulatory Visit: Payer: Self-pay | Admitting: Obstetrics and Gynecology

## 2022-06-05 DIAGNOSIS — N939 Abnormal uterine and vaginal bleeding, unspecified: Secondary | ICD-10-CM

## 2022-06-13 ENCOUNTER — Encounter: Payer: Self-pay | Admitting: *Deleted

## 2022-06-14 NOTE — Telephone Encounter (Signed)
FYI. Pt is scheduled on 07/10/22 @ 1230.

## 2022-06-19 ENCOUNTER — Ambulatory Visit (INDEPENDENT_AMBULATORY_CARE_PROVIDER_SITE_OTHER): Payer: Commercial Managed Care - PPO | Admitting: Obstetrics and Gynecology

## 2022-06-19 ENCOUNTER — Encounter: Payer: Self-pay | Admitting: Obstetrics and Gynecology

## 2022-06-19 VITALS — BP 128/68 | Wt 185.0 lb

## 2022-06-19 DIAGNOSIS — Z9889 Other specified postprocedural states: Secondary | ICD-10-CM

## 2022-06-19 DIAGNOSIS — K602 Anal fissure, unspecified: Secondary | ICD-10-CM

## 2022-06-19 DIAGNOSIS — N809 Endometriosis, unspecified: Secondary | ICD-10-CM

## 2022-06-19 NOTE — Patient Instructions (Signed)
Anal Fissure, Adult  An anal fissure is a small tear or crack in the tissue of the anus. Bleeding from a fissure usually stops on its own within a few minutes. However, bleeding will often occur again with each bowel movement until the fissure heals. What are the causes? This condition is usually caused by passing a large or hard stool (feces). Other causes include: Constipation. Frequent diarrhea. Inflammatory bowel disease (Crohn's disease or ulcerative colitis). Childbirth. Infections. Anal sex. What are the signs or symptoms? Symptoms of this condition include: Bleeding from the rectum. Small amounts of blood seen on your stool, on the toilet paper, or in the toilet after a bowel movement. The blood coats the outside of the stool and is not mixed with the stool. Painful bowel movements. Itching or irritation around the anus. How is this diagnosed? A health care provider may diagnose this condition by closely examining the anal area. An anal fissure can usually be seen with careful inspection. In some cases, a rectal exam may be performed, or a short tube (anoscope) may be used to examine the anal canal. How is this treated? Initial treatment for this condition may include: Taking steps to avoid constipation. This may include making changes to your diet, such as increasing your intake of fiber or fluid. Taking fiber supplements. These supplements can soften your stool to help make bowel movements easier. Your health care provider may also prescribe a stool softener if your stool is hard. Taking sitz baths. This may help to heal the tear. Using medicated creams or ointments. These may be prescribed to lessen discomfort. Treatments that are sometimes used if initial treatments do not work well or if the condition is more severe may include: Botulinum injection. Surgery to repair the fissure. Follow these instructions at home: Eating and drinking  Avoid foods that may cause  constipation, such as bananas, milk, and other dairy products. Eat all fruits, except bananas. Drink enough fluid to keep your urine pale yellow. Eat foods that are high in fiber, such as beans, whole grains, and fresh fruits and vegetables. General instructions  Take over-the-counter and prescription medicines only as told by your health care provider. Use creams or ointments only as told by your health care provider. Keep the anal area clean and dry. Take sitz baths as told by your health care provider. Do not use soap in the sitz baths. Keep all follow-up visits as told by your health care provider. This is important. Contact a health care provider if you have: More bleeding. A fever. Diarrhea that is mixed with blood. Pain that continues. Ongoing problems that are getting worse rather than better. Summary An anal fissure is a small tear or crack in the tissue of the anus. This condition is usually caused by passing a large or hard stool (feces). Other causes include constipation and frequent diarrhea. Initial treatment for this condition may include taking steps to avoid constipation, such as increasing your intake of fiber or fluid. Follow instructions for care as told by your health care provider. Contact your health care provider if you have more bleeding or your problem is getting worse rather than better. Keep all follow-up visits as told by your health care provider. This is important. This information is not intended to replace advice given to you by your health care provider. Make sure you discuss any questions you have with your health care provider. Document Revised: 06/22/2021 Document Reviewed: 06/22/2021 Elsevier Patient Education  [place].

## 2022-06-19 NOTE — Progress Notes (Signed)
GYNECOLOGY  VISIT   HPI: 46 y.o.   Single Black or African American Not Hispanic or Latino  female   [phone] with No LMP recorded.   here for post op. On 05/22/22 she underwent diagnostic laparoscopy, lysis of adhesions and drainage of bilateral endometriomas.   She is having painful bm's, hurts at the opening of her anus when she has a BM. She was constipated after surgery, that's when the pain started. Now her stools are back to her normal soft BM every day. The pain is getting better and better.    GYNECOLOGIC HISTORY: No LMP recorded. Contraception:tubal ligation  Menopausal hormone therapy: none         OB History     Gravida  4   Para  3   Term  2   Preterm  1   AB  1   Living  3      SAB  1   IAB  0   Ectopic      Multiple      Live Births  3              Patient Active Problem List   Diagnosis Date Noted   S/P laparoscopic hysterectomy 05/22/2022   Elevated cholesterol 04/30/2022   Preterm premature rupture of membranes in third trimester 31 wk 09/03/2013    Past Medical History:  Diagnosis Date   Abnormal Pap smear 2004   Abnormal uterine bleeding    Bilateral ovarian cysts 2022   bilateral complex ovarian cysts   Dysmenorrhea    Elevated cholesterol    Endometriosis    Iron deficiency anemia 2022   due to chronic blood loss / heavy bleeding, Hgb 11.3 on 09/06/21 and Hgb 9.6 on 06/16/21. Currently on iron supplementation as of 05/09/2021.   Vitamin D deficiency    Wears glasses    reading only    Past Surgical History:  Procedure Laterality Date   CESAREAN SECTION N/A 09/06/2013   Procedure: Primary cesarean section with delivery of baby boy at 1136.  Apgars 9/9.;  Surgeon: Jonnie Kind, MD;  Location: [facility];  Service: Obstetrics;  Laterality: N/A;   ENDOMETRIAL BIOPSY  11/2021   proliferative endometrium   suction D&C  2010   SAB   TOTAL LAPAROSCOPIC HYSTERECTOMY WITH BILATERAL SALPINGO OOPHORECTOMY Bilateral 05/22/2022   Procedure:  DIAGNOSTIC LAPAROSCOPY; LYSIS OF ADHESIONS; BILATERAL ENDOMETRIOMAS;  Surgeon: Salvadore Dom, MD;  Location: [facility];  Service: Gynecology;  Laterality: Bilateral;   TUBAL LIGATION  09/06/2013    Current Outpatient Medications  Medication Sig Dispense Refill   acetaminophen (TYLENOL) 500 MG tablet Take 2 tablets (1,000 mg total) by mouth every 6 (six) hours as needed. 100 tablet 2   atorvastatin (LIPITOR) 40 MG tablet Take 40 mg by mouth daily.     BLISOVI FE 1/20 1-20 MG-MCG tablet TAKE 1 TABLET BY MOUTH DAILY 84 tablet 3   Ferrous Sulfate (IRON PO) Take by mouth daily.     ibuprofen (ADVIL) 800 MG tablet Take 1 tablet (800 mg total) by mouth every 8 (eight) hours as needed. 30 tablet 0   Magnesium 250 MG TABS Take by mouth.     Multiple Vitamins-Minerals (HAIR SKIN AND NAILS FORMULA PO) Take by mouth.     Vitamin D, Ergocalciferol, (DRISDOL) 1.25 MG (50000 UNIT) CAPS capsule Take 50,000 Units by mouth once a week.     No current facility-administered medications for this visit.  ALLERGIES: Patient has no known allergies.  Family History  Problem Relation Age of Onset   Hyperlipidemia Mother    Hypertension Mother    Hypertension Father    Hyperlipidemia Father    Hyperlipidemia Maternal Grandmother    Cancer Other    Lupus Sister    Breast cancer Other     Social History   Socioeconomic History   Marital status: Single    Spouse name: Not on file   Number of children: Not on file   Years of education: Not on file   Highest education level: Not on file  Occupational History   Not on file  Tobacco Use   Smoking status: Never   Smokeless tobacco: Never  Vaping Use   Vaping Use: Former   Quit date: 12/11/2015  Substance and Sexual Activity   Alcohol use: Yes    Comment: 2 drinks on the weekend sometimes   Drug use: No   Sexual activity: Yes    Birth control/protection: Surgical, Pill    Comment: tubal ligation  Other Topics Concern    Not on file  Social History Narrative   Not on file   Social Determinants of Health   Financial Resource Strain: Not on file  Food Insecurity: Not on file  Transportation Needs: Not on file  Physical Activity: Not on file  Stress: Not on file  Social Connections: Not on file  Intimate Partner Violence: Not on file    Review of Systems  All other systems reviewed and are negative.   PHYSICAL EXAMINATION:    BP 128/68   Wt 185 lb (83.9 kg)   BMI 33.84 kg/m     General appearance: alert, cooperative and appears stated age Abdomen: soft, non-tender; non distended, no masses,  no organomegaly Incisions are well healed  Pelvic: External genitalia:  no lesions              Urethra:  normal appearing urethra with no masses, tenderness or lesions              Bartholins and Skenes: normal                Anus: fissure noted at 12 o'clock  Chaperone was present for exam.  1. S/P laparoscopy Well healed  2. Endometriosis She has an appointment at the [facility] next month to discuss hysterectomy  3. Anal fissure Discussed care

## 2022-11-10 ENCOUNTER — Other Ambulatory Visit: Payer: Self-pay | Admitting: Obstetrics and Gynecology

## 2022-11-10 DIAGNOSIS — N939 Abnormal uterine and vaginal bleeding, unspecified: Secondary | ICD-10-CM

## 2022-11-12 NOTE — Telephone Encounter (Signed)
Med refill request:Blisovi Fe1/20  Rx sent on 06/05/22. #[id number]  Rx refused with msg to pharmacy.   Routing to provider for final review.  Will close encounter.'

## 2023-03-10 IMAGING — US US THYROID
1 series · 13 of 25 positions shown · non-contrast
Comparison: None.

CLINICAL DATA: Nodule on physical examination

EXAM:
THYROID ULTRASOUND
TECHNIQUE: Ultrasound examination of the thyroid gland and adjacent soft
tissues was performed.

[Series 1: us thyroid · 0.05mm/px · 13 of 47 slices shown]
[im 1/47]
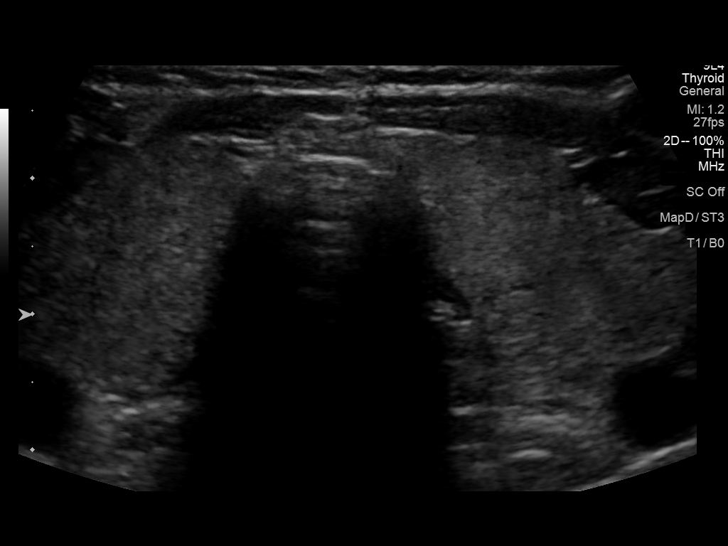
[im 4/47]
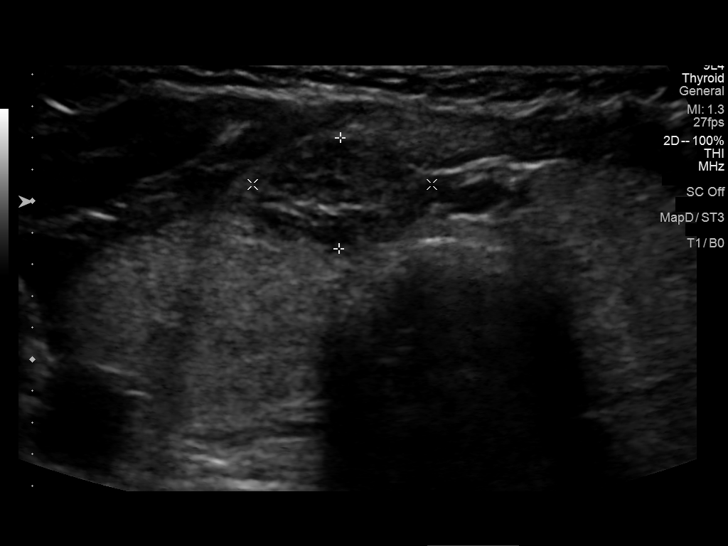
[im 8/47]
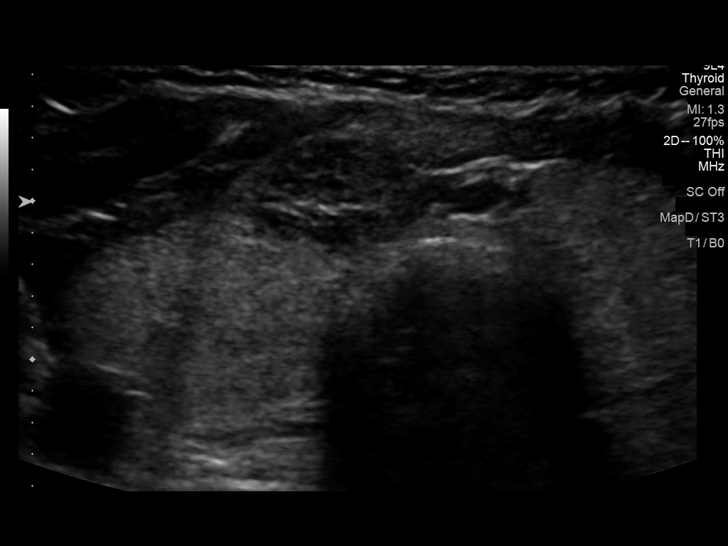
[im 12/47]
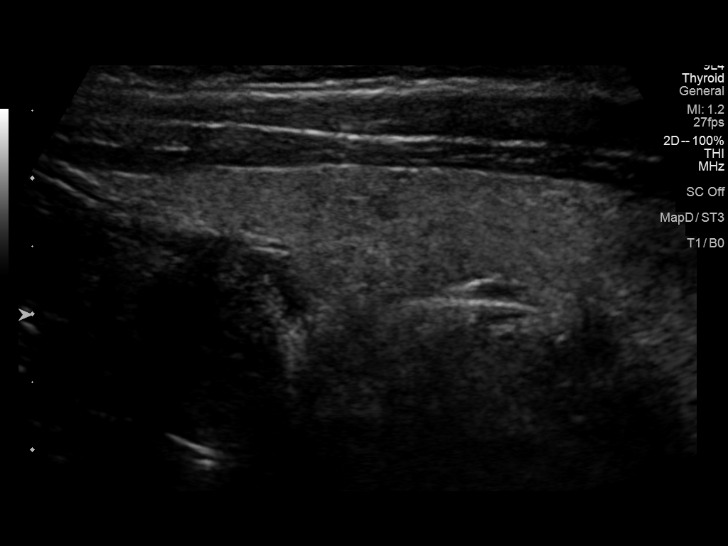
[im 16/47]
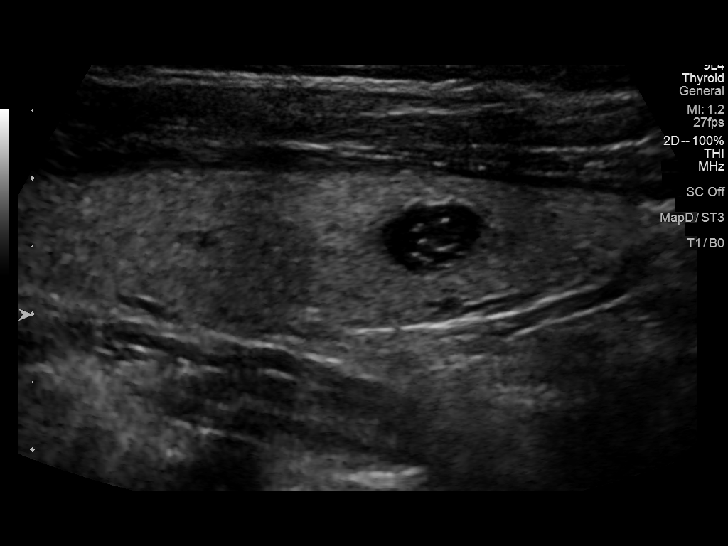
[im 20/47]
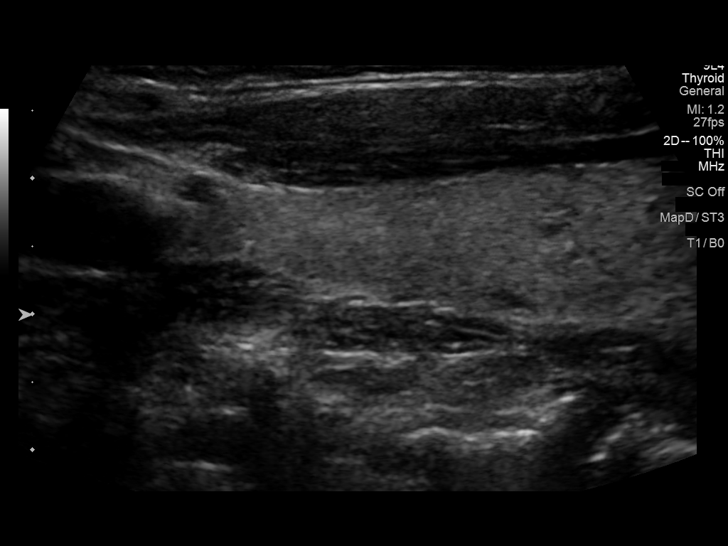
[im 24/47]
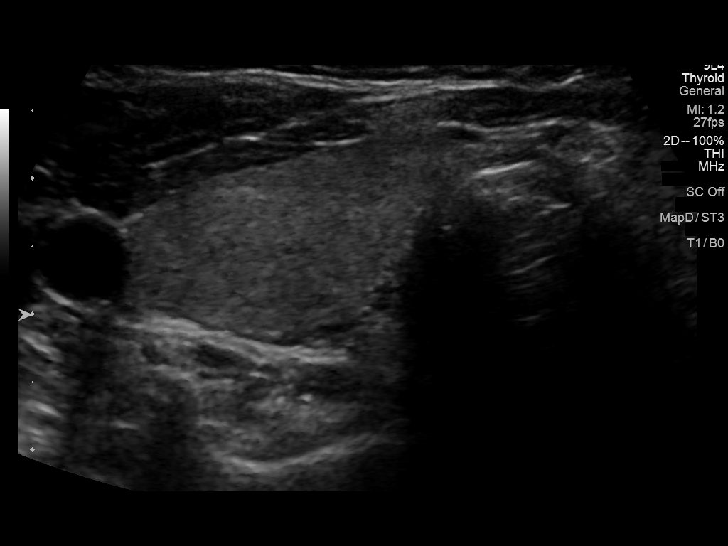
[im 27/47]
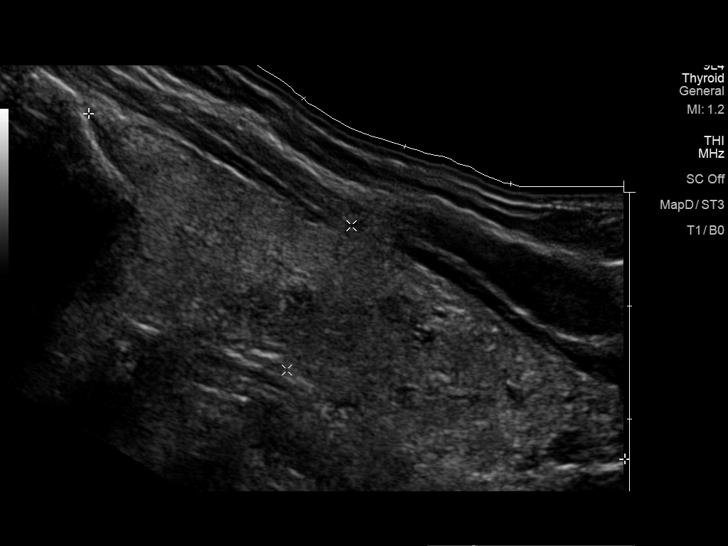
[im 31/47]
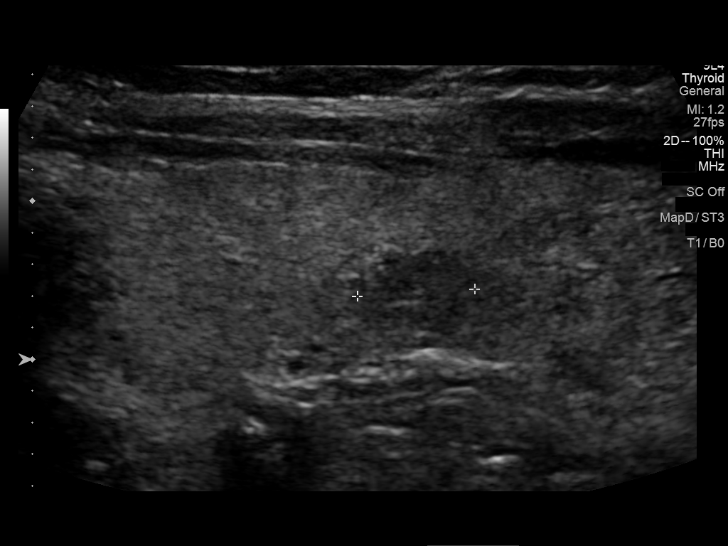
[im 35/47]
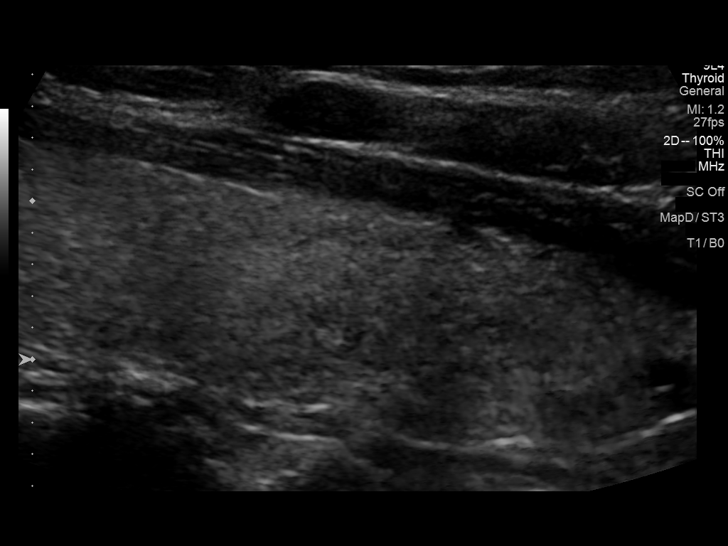
[im 39/47]
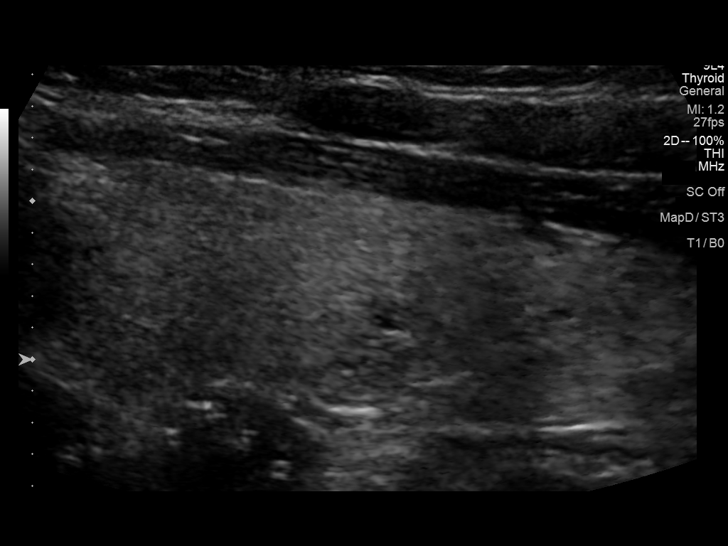
[im 43/47]
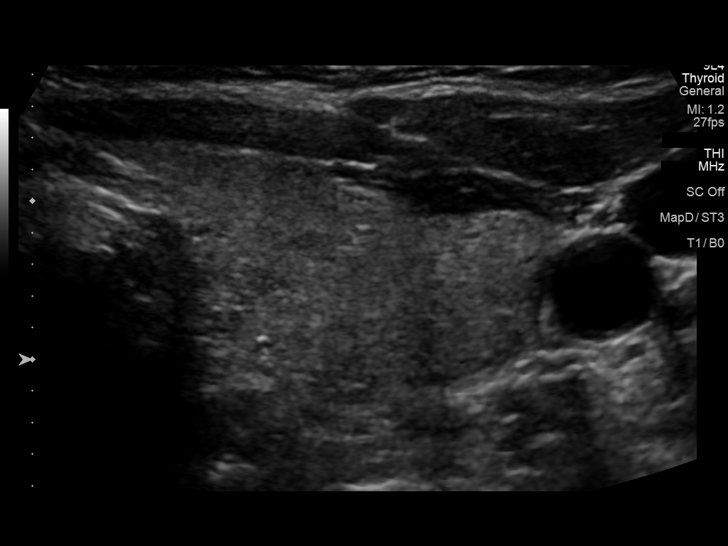
[im 47/47]
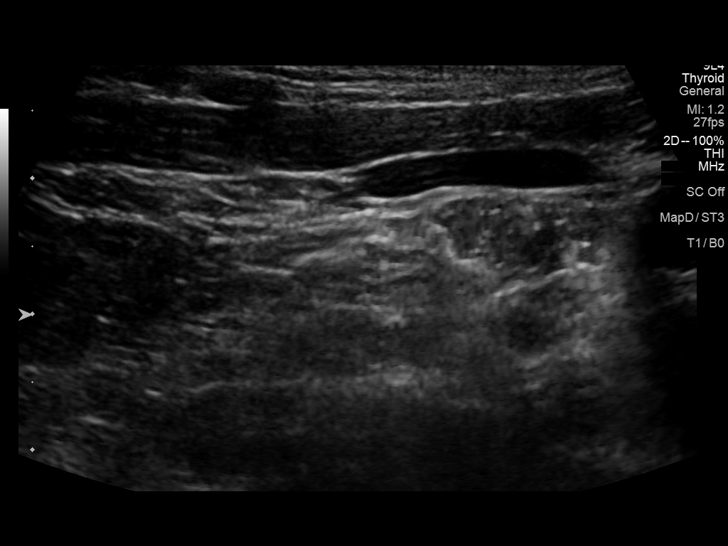

[13 of 25 positions shown; findings below may reference images not displayed]

FINDINGS: Parenchymal Echotexture: Mildly heterogeneous

Isthmus: 0.5 cm

Right lobe: 5.9 x 1.6 x 2.1 cm

Left lobe: 4.6 x 1.4 x 2.3 cm

_________________________________________________________

Estimated total number of nodules >/= 1 cm: 2

Number of spongiform nodules >/=  2 cm not described below (TR1): 0

Number of mixed cystic and solid nodules >/= 1.5 cm not described
below (TR2): 0

_________________________________________________________

Nodule # 1:

Location: Isthmus; mid

Maximum size: 1.4 cm; Other 2 dimensions: 1.1 x 0.7 cm

Composition: solid/almost completely solid (2)

Echogenicity: hypoechoic (2)

Shape: not taller-than-wide (0)

Margins: smooth (0)

Echogenic foci: none (0)

ACR TI-RADS total points: 4.

ACR TI-RADS risk category: TR4 (4-6 points).

ACR TI-RADS recommendations:

*Given size (>/= 1 - 1.4 cm) and appearance, a follow-up ultrasound
in 1 year should be considered based on TI-RADS criteria.

_________________________________________________________

Nodule 2: 0.9 cm spongiform nodule in the inferior right thyroid
lobe does not meet criteria for imaging surveillance or FNA.

_________________________________________________________

Nodule 3: 0.8 cm solid hypoechoic nodule in the mid left thyroid
lobe does not meet criteria for FNA or surveillance.

_________________________________________________________

Nodule # 4:

Location: Left; inferior

Maximum size: 1.4 cm; Other 2 dimensions: 1.3 x 0.9 cm

Composition: solid/almost completely solid (2)

Echogenicity: isoechoic (1)

Shape: not taller-than-wide (0)

Margins: ill-defined (0)

Echogenic foci: none (0)

ACR TI-RADS total points: 3.

ACR TI-RADS risk category: TR3 (3 points).

ACR TI-RADS recommendations:

Given size (<1.4 cm) and appearance, this nodule does NOT meet
TI-RADS criteria for biopsy or dedicated follow-up.

_________________________________________________________
IMPRESSION: Nodule 1 (TI-RADS 4) located in the mid isthmus meets criteria for
imaging follow-up. Annual ultrasound surveillance is recommended
until 5 years of stability is documented.

The above is in keeping with the ACR TI-RADS recommendations - [HOSPITAL] 5700;[DATE].
